# Patient Record
Sex: Female | Born: 1970 | Race: Black or African American | Hispanic: No | Smoking: Never smoker
Health system: Southern US, Community
[De-identification: ages and names within clinical notes are randomized; demographics above are authoritative.]

## PROBLEM LIST (undated history)

## (undated) DIAGNOSIS — R7303 Prediabetes: Secondary | ICD-10-CM

## (undated) DIAGNOSIS — T7840XA Allergy, unspecified, initial encounter: Secondary | ICD-10-CM

## (undated) DIAGNOSIS — E785 Hyperlipidemia, unspecified: Secondary | ICD-10-CM

## (undated) DIAGNOSIS — K5790 Diverticulosis of intestine, part unspecified, without perforation or abscess without bleeding: Secondary | ICD-10-CM

## (undated) DIAGNOSIS — D573 Sickle-cell trait: Secondary | ICD-10-CM

## (undated) DIAGNOSIS — E739 Lactose intolerance, unspecified: Secondary | ICD-10-CM

## (undated) DIAGNOSIS — E042 Nontoxic multinodular goiter: Secondary | ICD-10-CM

## (undated) DIAGNOSIS — M7989 Other specified soft tissue disorders: Secondary | ICD-10-CM

## (undated) DIAGNOSIS — I509 Heart failure, unspecified: Secondary | ICD-10-CM

## (undated) DIAGNOSIS — K8020 Calculus of gallbladder without cholecystitis without obstruction: Secondary | ICD-10-CM

## (undated) DIAGNOSIS — M549 Dorsalgia, unspecified: Secondary | ICD-10-CM

## (undated) DIAGNOSIS — K5900 Constipation, unspecified: Secondary | ICD-10-CM

## (undated) DIAGNOSIS — J309 Allergic rhinitis, unspecified: Secondary | ICD-10-CM

## (undated) DIAGNOSIS — E782 Mixed hyperlipidemia: Secondary | ICD-10-CM

## (undated) DIAGNOSIS — D259 Leiomyoma of uterus, unspecified: Secondary | ICD-10-CM

## (undated) DIAGNOSIS — K5792 Diverticulitis of intestine, part unspecified, without perforation or abscess without bleeding: Secondary | ICD-10-CM

## (undated) DIAGNOSIS — I10 Essential (primary) hypertension: Secondary | ICD-10-CM

## (undated) DIAGNOSIS — R0602 Shortness of breath: Secondary | ICD-10-CM

## (undated) DIAGNOSIS — I700 Atherosclerosis of aorta: Secondary | ICD-10-CM

## (undated) DIAGNOSIS — D1803 Hemangioma of intra-abdominal structures: Secondary | ICD-10-CM

## (undated) DIAGNOSIS — E669 Obesity, unspecified: Secondary | ICD-10-CM

## (undated) DIAGNOSIS — F32A Depression, unspecified: Secondary | ICD-10-CM

## (undated) HISTORY — DX: Lactose intolerance, unspecified: E73.9

## (undated) HISTORY — PX: OPEN REDUCTION INTERNAL FIXATION (ORIF) METACARPAL: SHX6234

## (undated) HISTORY — DX: Diverticulitis of intestine, part unspecified, without perforation or abscess without bleeding: K57.92

## (undated) HISTORY — DX: Allergy, unspecified, initial encounter: T78.40XA

## (undated) HISTORY — DX: Other specified soft tissue disorders: M79.89

## (undated) HISTORY — DX: Heart failure, unspecified: I50.9

## (undated) HISTORY — DX: Constipation, unspecified: K59.00

## (undated) HISTORY — DX: Obesity, unspecified: E66.9

## (undated) HISTORY — DX: Diverticulosis of intestine, part unspecified, without perforation or abscess without bleeding: K57.90

## (undated) HISTORY — DX: Dorsalgia, unspecified: M54.9

## (undated) HISTORY — DX: Shortness of breath: R06.02

## (undated) HISTORY — DX: Sickle-cell trait: D57.3

## (undated) HISTORY — PX: BLADDER REMOVAL: SHX567

## (undated) HISTORY — DX: Mixed hyperlipidemia: E78.2

## (undated) HISTORY — DX: Allergic rhinitis, unspecified: J30.9

## (undated) HISTORY — PX: HAND SURGERY: SHX662

## (undated) HISTORY — DX: Depression, unspecified: F32.A

## (undated) HISTORY — PX: CHOLECYSTECTOMY, LAPAROSCOPIC: SHX56

## (undated) HISTORY — DX: Calculus of gallbladder without cholecystitis without obstruction: K80.20

## (undated) HISTORY — DX: Leiomyoma of uterus, unspecified: D25.9

## (undated) HISTORY — DX: Nontoxic multinodular goiter: E04.2

## (undated) HISTORY — DX: Essential (primary) hypertension: I10

## (undated) HISTORY — DX: Prediabetes: R73.03

## (undated) HISTORY — DX: Hemangioma of intra-abdominal structures: D18.03

## (undated) HISTORY — PX: TUBAL LIGATION: SHX77

## (undated) HISTORY — DX: Atherosclerosis of aorta: I70.0

## (undated) HISTORY — DX: Hyperlipidemia, unspecified: E78.5

## (undated) HISTORY — PX: COLONOSCOPY: SHX174

---

## 1999-07-15 ENCOUNTER — Ambulatory Visit (HOSPITAL_COMMUNITY): Admission: RE | Admit: 1999-07-15 | Discharge: 1999-07-15 | Payer: Self-pay | Admitting: *Deleted

## 1999-10-06 ENCOUNTER — Encounter (INDEPENDENT_AMBULATORY_CARE_PROVIDER_SITE_OTHER): Payer: Self-pay

## 1999-10-06 ENCOUNTER — Ambulatory Visit (HOSPITAL_COMMUNITY): Admission: RE | Admit: 1999-10-06 | Discharge: 1999-10-07 | Payer: Self-pay | Admitting: General Surgery

## 1999-12-12 ENCOUNTER — Other Ambulatory Visit: Admission: RE | Admit: 1999-12-12 | Discharge: 1999-12-12 | Payer: Self-pay | Admitting: Obstetrics and Gynecology

## 2000-08-06 ENCOUNTER — Inpatient Hospital Stay (HOSPITAL_COMMUNITY): Admission: AD | Admit: 2000-08-06 | Discharge: 2000-08-06 | Payer: Self-pay | Admitting: Internal Medicine

## 2000-08-09 ENCOUNTER — Inpatient Hospital Stay (HOSPITAL_COMMUNITY): Admission: AD | Admit: 2000-08-09 | Discharge: 2000-08-09 | Payer: Self-pay | Admitting: Obstetrics & Gynecology

## 2000-08-16 ENCOUNTER — Inpatient Hospital Stay (HOSPITAL_COMMUNITY): Admission: AD | Admit: 2000-08-16 | Discharge: 2000-08-16 | Payer: Self-pay | Admitting: Obstetrics & Gynecology

## 2000-08-24 ENCOUNTER — Encounter: Admission: RE | Admit: 2000-08-24 | Discharge: 2000-08-24 | Payer: Self-pay | Admitting: Obstetrics & Gynecology

## 2000-08-31 ENCOUNTER — Encounter: Admission: RE | Admit: 2000-08-31 | Discharge: 2000-08-31 | Payer: Self-pay | Admitting: Obstetrics & Gynecology

## 2000-12-05 ENCOUNTER — Other Ambulatory Visit: Admission: RE | Admit: 2000-12-05 | Discharge: 2000-12-05 | Payer: Self-pay | Admitting: Obstetrics and Gynecology

## 2001-04-22 ENCOUNTER — Inpatient Hospital Stay (HOSPITAL_COMMUNITY): Admission: AD | Admit: 2001-04-22 | Discharge: 2001-04-22 | Payer: Self-pay | Admitting: Obstetrics and Gynecology

## 2001-04-23 ENCOUNTER — Inpatient Hospital Stay (HOSPITAL_COMMUNITY): Admission: AD | Admit: 2001-04-23 | Discharge: 2001-04-23 | Payer: Self-pay | Admitting: *Deleted

## 2001-07-03 ENCOUNTER — Inpatient Hospital Stay (HOSPITAL_COMMUNITY): Admission: RE | Admit: 2001-07-03 | Discharge: 2001-07-06 | Payer: Self-pay | Admitting: Obstetrics and Gynecology

## 2001-08-01 ENCOUNTER — Other Ambulatory Visit: Admission: RE | Admit: 2001-08-01 | Discharge: 2001-08-01 | Payer: Self-pay | Admitting: Obstetrics and Gynecology

## 2002-08-04 ENCOUNTER — Other Ambulatory Visit: Admission: RE | Admit: 2002-08-04 | Discharge: 2002-08-04 | Payer: Self-pay | Admitting: Obstetrics & Gynecology

## 2003-01-01 ENCOUNTER — Ambulatory Visit (HOSPITAL_COMMUNITY): Admission: RE | Admit: 2003-01-01 | Discharge: 2003-01-01 | Payer: Self-pay | Admitting: Obstetrics

## 2003-12-18 ENCOUNTER — Emergency Department (HOSPITAL_COMMUNITY): Admission: EM | Admit: 2003-12-18 | Discharge: 2003-12-18 | Payer: Self-pay | Admitting: Emergency Medicine

## 2003-12-25 ENCOUNTER — Ambulatory Visit (HOSPITAL_BASED_OUTPATIENT_CLINIC_OR_DEPARTMENT_OTHER): Admission: RE | Admit: 2003-12-25 | Discharge: 2003-12-25 | Payer: Self-pay | Admitting: Orthopedic Surgery

## 2008-01-20 ENCOUNTER — Ambulatory Visit (HOSPITAL_COMMUNITY): Admission: RE | Admit: 2008-01-20 | Discharge: 2008-01-20 | Payer: Self-pay | Admitting: General Practice

## 2008-06-11 ENCOUNTER — Encounter: Admission: RE | Admit: 2008-06-11 | Discharge: 2008-06-11 | Payer: Self-pay | Admitting: General Practice

## 2011-03-03 NOTE — Op Note (Signed)
   NAME:  COREY, CAULFIELD                          ACCOUNT NO.:  [id number]   MEDICAL RECORD NO.:  [id number]                   PATIENT TYPE:  AMB   LOCATION:  SDC                                  FACILITY:  [facility]   PHYSICIAN:  Kathreen Cosier, M.D.           DATE OF BIRTH:  [birth year]   DATE OF PROCEDURE:  01/01/2003  DATE OF DISCHARGE:                                 OPERATIVE REPORT   PREOPERATIVE DIAGNOSES:  Multiparity.   PROCEDURE:  Laparoscopic tubal sterilization.  Under general anesthesia  patient in lithotomy position.  Abdomen, perineum, and vagina prepped and  draped.  Bladder emptied with straight catheter.  Weighted speculum placed  in vagina and the Hulka tenaculum placed in the cervix.  In the umbilicus a  transverse incision made, carried down to the fascia.  The fascia cleaned,  grasped with two Kochers.  Fascia in the peritoneum opened with the Mayo  scissors.  The sleeve of the trocar inserted intraperitoneally.  3 L carbon  dioxide infused intraperitoneally.  Visualizing scope inserted.  Uterus,  tubes, and ovaries are normal.  Cautery probe inserted.  Right tube grasped  1 inch from the cornu and cauterized.  Tube was cauterized in a total of  four places moving lateral from the first site of cautery.  Procedure was  done in the exact fashion on the other side.  Probe removed.  CO2 allowed to  escape from the peritoneal cavity.  Fascia closed with one stitch of 0 Dexon  and the skin closed with subcuticular stitch of 3-0 plain.  The patient  tolerated procedure well.  Taken to recovery room in good condition.                                               Kathreen Cosier, M.D.    BAM/MEDQ  D:  01/01/2003  T:  01/01/2003  Job:  [id number]

## 2011-03-06 ENCOUNTER — Emergency Department (HOSPITAL_COMMUNITY)
Admission: EM | Admit: 2011-03-06 | Discharge: 2011-03-07 | Disposition: A | Discharge status: Home / Self Care | Payer: Self-pay | Attending: Emergency Medicine | Admitting: Emergency Medicine

## 2011-03-06 DIAGNOSIS — R609 Edema, unspecified: Secondary | ICD-10-CM | POA: Insufficient documentation

## 2011-03-06 DIAGNOSIS — M7989 Other specified soft tissue disorders: Secondary | ICD-10-CM | POA: Insufficient documentation

## 2011-03-06 LAB — CBC
HCT: 35.5 % — ABNORMAL LOW (ref 36.0–46.0)
MCV: 88.1 fL (ref 78.0–100.0)
RBC: 4.03 MIL/uL (ref 3.87–5.11)
RDW: 13.6 % (ref 11.5–15.5)
WBC: 7 10*3/uL (ref 4.0–10.5)

## 2011-03-06 LAB — URINALYSIS, ROUTINE W REFLEX MICROSCOPIC
Bilirubin Urine: NEGATIVE
Glucose, UA: NEGATIVE mg/dL
Hgb urine dipstick: NEGATIVE
Specific Gravity, Urine: 1.03 (ref 1.005–1.030)
pH: 5.5 (ref 5.0–8.0)

## 2011-03-06 LAB — DIFFERENTIAL
Eosinophils Relative: 3 % (ref 0–5)
Lymphocytes Relative: 45 % (ref 12–46)
Lymphs Abs: 3.2 10*3/uL (ref 0.7–4.0)

## 2011-03-06 LAB — COMPREHENSIVE METABOLIC PANEL
ALT: 10 U/L (ref 0–35)
Alkaline Phosphatase: 80 U/L (ref 39–117)
CO2: 29 mEq/L (ref 19–32)
Chloride: 102 mEq/L (ref 96–112)
GFR calc non Af Amer: >60 mL/min (ref >60)
Glucose, Bld: 122 mg/dL — ABNORMAL HIGH (ref 70–99)
Potassium: 3.1 mEq/L — ABNORMAL LOW (ref 3.5–5.1)
Sodium: 138 mEq/L (ref 135–145)

## 2011-09-05 ENCOUNTER — Other Ambulatory Visit (HOSPITAL_COMMUNITY): Payer: Self-pay | Admitting: General Practice

## 2011-09-05 DIAGNOSIS — Z1231 Encounter for screening mammogram for malignant neoplasm of breast: Secondary | ICD-10-CM

## 2011-09-06 ENCOUNTER — Ambulatory Visit (HOSPITAL_COMMUNITY)
Admission: RE | Admit: 2011-09-06 | Discharge: 2011-09-06 | Disposition: A | Discharge status: Home / Self Care | Payer: Self-pay | Source: Ambulatory Visit | Attending: General Practice | Admitting: General Practice

## 2011-09-06 DIAGNOSIS — Z1231 Encounter for screening mammogram for malignant neoplasm of breast: Secondary | ICD-10-CM

## 2012-12-06 ENCOUNTER — Other Ambulatory Visit (HOSPITAL_COMMUNITY): Payer: Self-pay | Admitting: Nurse Practitioner

## 2012-12-06 DIAGNOSIS — Z1231 Encounter for screening mammogram for malignant neoplasm of breast: Secondary | ICD-10-CM

## 2012-12-11 DIAGNOSIS — E785 Hyperlipidemia, unspecified: Secondary | ICD-10-CM | POA: Insufficient documentation

## 2012-12-13 ENCOUNTER — Ambulatory Visit (HOSPITAL_COMMUNITY): Payer: Self-pay

## 2012-12-16 ENCOUNTER — Ambulatory Visit (HOSPITAL_COMMUNITY)
Admission: RE | Admit: 2012-12-16 | Discharge: 2012-12-16 | Disposition: A | Discharge status: Home / Self Care | Payer: Medicaid Other | Source: Ambulatory Visit | Attending: Nurse Practitioner | Admitting: Nurse Practitioner

## 2012-12-16 DIAGNOSIS — Z1231 Encounter for screening mammogram for malignant neoplasm of breast: Secondary | ICD-10-CM | POA: Insufficient documentation

## 2014-02-04 DIAGNOSIS — R609 Edema, unspecified: Secondary | ICD-10-CM | POA: Insufficient documentation

## 2014-02-04 DIAGNOSIS — J302 Other seasonal allergic rhinitis: Secondary | ICD-10-CM | POA: Insufficient documentation

## 2014-02-04 DIAGNOSIS — R490 Dysphonia: Secondary | ICD-10-CM | POA: Insufficient documentation

## 2015-03-21 ENCOUNTER — Encounter (HOSPITAL_BASED_OUTPATIENT_CLINIC_OR_DEPARTMENT_OTHER): Payer: Self-pay | Admitting: Emergency Medicine

## 2015-03-21 ENCOUNTER — Emergency Department (HOSPITAL_BASED_OUTPATIENT_CLINIC_OR_DEPARTMENT_OTHER): Payer: Medicaid Other

## 2015-03-21 ENCOUNTER — Emergency Department (HOSPITAL_BASED_OUTPATIENT_CLINIC_OR_DEPARTMENT_OTHER)
Admission: EM | Admit: 2015-03-21 | Discharge: 2015-03-21 | Disposition: A | Discharge status: Home / Self Care | Payer: Medicaid Other | Attending: Emergency Medicine | Admitting: Emergency Medicine

## 2015-03-21 ENCOUNTER — Emergency Department (HOSPITAL_COMMUNITY)
Admission: EM | Admit: 2015-03-21 | Discharge: 2015-03-21 | Disposition: A | Discharge status: Home / Self Care | Payer: Medicaid Other

## 2015-03-21 DIAGNOSIS — K5733 Diverticulitis of large intestine without perforation or abscess with bleeding: Secondary | ICD-10-CM | POA: Insufficient documentation

## 2015-03-21 DIAGNOSIS — Z3202 Encounter for pregnancy test, result negative: Secondary | ICD-10-CM | POA: Diagnosis not present

## 2015-03-21 DIAGNOSIS — R1031 Right lower quadrant pain: Secondary | ICD-10-CM | POA: Diagnosis present

## 2015-03-21 LAB — COMPREHENSIVE METABOLIC PANEL
ALBUMIN: 3.5 g/dL (ref 3.5–5.0)
ALT: 11 U/L — ABNORMAL LOW (ref 14–54)
ANION GAP: 6 (ref 5–15)
AST: 16 U/L (ref 15–41)
Alkaline Phosphatase: 76 U/L (ref 38–126)
BUN: 14 mg/dL (ref 6–20)
CALCIUM: 8.7 mg/dL — AB (ref 8.9–10.3)
CO2: 29 mmol/L (ref 22–32)
CREATININE: 0.81 mg/dL (ref 0.44–1.00)
Chloride: 106 mmol/L (ref 101–111)
GFR calc non Af Amer: >60 mL/min (ref >60)
Glucose, Bld: 98 mg/dL (ref 65–99)
Potassium: 3.4 mmol/L — ABNORMAL LOW (ref 3.5–5.1)
Sodium: 141 mmol/L (ref 135–145)
TOTAL PROTEIN: 7.7 g/dL (ref 6.5–8.1)
Total Bilirubin: 0.3 mg/dL (ref 0.3–1.2)

## 2015-03-21 LAB — CBC WITH DIFFERENTIAL/PLATELET
BASOS ABS: 0 10*3/uL (ref 0.0–0.1)
BASOS PCT: 0 % (ref 0–1)
Eosinophils Absolute: 0.2 10*3/uL (ref 0.0–0.7)
Eosinophils Relative: 3 % (ref 0–5)
HEMATOCRIT: 39.3 % (ref 36.0–46.0)
Hemoglobin: 13.1 g/dL (ref 12.0–15.0)
LYMPHS ABS: 1.7 10*3/uL (ref 0.7–4.0)
Lymphocytes Relative: 30 % (ref 12–46)
MCH: 29 pg (ref 26.0–34.0)
MCHC: 33.3 g/dL (ref 30.0–36.0)
MCV: 87.1 fL (ref 78.0–100.0)
Monocytes Absolute: 0.6 10*3/uL (ref 0.1–1.0)
Monocytes Relative: 10 % (ref 3–12)
NEUTROS PCT: 57 % (ref 43–77)
Neutro Abs: 3.3 10*3/uL (ref 1.7–7.7)
PLATELETS: 235 10*3/uL (ref 150–400)
RBC: 4.51 MIL/uL (ref 3.87–5.11)
RDW: 12.5 % (ref 11.5–15.5)
WBC: 5.7 10*3/uL (ref 4.0–10.5)

## 2015-03-21 LAB — URINALYSIS, ROUTINE W REFLEX MICROSCOPIC
Bilirubin Urine: NEGATIVE
GLUCOSE, UA: NEGATIVE mg/dL
Ketones, ur: NEGATIVE mg/dL
LEUKOCYTES UA: NEGATIVE
NITRITE: NEGATIVE
PH: 5.5 (ref 5.0–8.0)
Protein, ur: NEGATIVE mg/dL
Specific Gravity, Urine: 1.034 — ABNORMAL HIGH (ref 1.005–1.030)
Urobilinogen, UA: 0.2 mg/dL (ref 0.0–1.0)

## 2015-03-21 LAB — URINE MICROSCOPIC-ADD ON

## 2015-03-21 LAB — LIPASE, BLOOD: LIPASE: 16 U/L — AB (ref 22–51)

## 2015-03-21 LAB — PREGNANCY, URINE: Preg Test, Ur: NEGATIVE

## 2015-03-21 LAB — OCCULT BLOOD X 1 CARD TO LAB, STOOL: FECAL OCCULT BLD: POSITIVE — AB

## 2015-03-21 MED ORDER — OXYCODONE-ACETAMINOPHEN 5-325 MG PO TABS: ORAL_TABLET | ORAL | Status: DC

## 2015-03-21 MED ORDER — CIPROFLOXACIN HCL 500 MG PO TABS
500.0000 mg | ORAL_TABLET | Freq: Once | ORAL | Status: AC
  Administered 2015-03-21: 500 mg via ORAL
  Filled 2015-03-21: qty 1

## 2015-03-21 MED ORDER — IOHEXOL 300 MG/ML  SOLN
100.0000 mL | Freq: Once | INTRAMUSCULAR | Status: AC | PRN
  Administered 2015-03-21: 100 mL via INTRAVENOUS

## 2015-03-21 MED ORDER — ONDANSETRON HCL 4 MG/2ML IJ SOLN
4.0000 mg | Freq: Once | INTRAMUSCULAR | Status: AC
  Administered 2015-03-21: 4 mg via INTRAVENOUS
  Filled 2015-03-21: qty 2

## 2015-03-21 MED ORDER — MORPHINE SULFATE 4 MG/ML IJ SOLN
4.0000 mg | Freq: Once | INTRAMUSCULAR | Status: AC
  Administered 2015-03-21: 4 mg via INTRAVENOUS
  Filled 2015-03-21: qty 1

## 2015-03-21 MED ORDER — FLUCONAZOLE 150 MG PO TABS: 150.0000 mg | ORAL_TABLET | Freq: Every day | ORAL | Status: DC

## 2015-03-21 MED ORDER — MORPHINE SULFATE 4 MG/ML IJ SOLN
4.0000 mg | INTRAMUSCULAR | Status: DC | PRN
  Administered 2015-03-21: 4 mg via INTRAVENOUS
  Filled 2015-03-21: qty 1

## 2015-03-21 MED ORDER — ONDANSETRON HCL 4 MG PO TABS: 4.0000 mg | ORAL_TABLET | Freq: Three times a day (TID) | ORAL | Status: DC | PRN

## 2015-03-21 MED ORDER — METRONIDAZOLE 500 MG PO TABS: 500.0000 mg | ORAL_TABLET | Freq: Three times a day (TID) | ORAL | Status: DC

## 2015-03-21 MED ORDER — IOHEXOL 300 MG/ML  SOLN
25.0000 mL | Freq: Once | INTRAMUSCULAR | Status: AC | PRN
  Administered 2015-03-21: 25 mL via ORAL

## 2015-03-21 MED ORDER — CIPROFLOXACIN HCL 500 MG PO TABS: 500.0000 mg | ORAL_TABLET | Freq: Two times a day (BID) | ORAL | Status: DC

## 2015-03-21 MED ORDER — METRONIDAZOLE 500 MG PO TABS
500.0000 mg | ORAL_TABLET | Freq: Once | ORAL | Status: AC
  Administered 2015-03-21: 500 mg via ORAL
  Filled 2015-03-21: qty 1

## 2015-03-21 NOTE — ED Notes (Signed)
Pt left without being seen.

## 2015-03-21 NOTE — ED Notes (Signed)
Patient reports vomiting which began yesterday x 3.  Reports sharp abdominal pains with diarrhea and has seen clots of blood in her stool.  Reports bright red blood.

## 2015-03-21 NOTE — ED Provider Notes (Signed)
CSN: [id number]     Arrival date & time 03/21/15  1352 History   First MD Initiated Contact with Patient 03/21/15 1411     Chief Complaint  Patient presents with  . Abdominal Pain     (Consider location/radiation/quality/duration/timing/severity/associated sxs/prior Treatment) HPI   Crystal Shea is a 44 y.o. female complaining of 8 out of 10 bilateral lower abdominal pain associated with bloody diarrhea and multiple episodes of nonbilious, nonbloody, non-coffee ground emesis starting yesterday. Patient states that she has been tolerating by mouth today but the bloody diarrhea still continues. She denies fevers, chills, sick contacts, history of diverticulitis/diverticulosis, recent abx. No pain medication taken prior to arrival. No exacerbating or alleviating factors identified.  History reviewed. No pertinent past medical history. Past Surgical History  Procedure Laterality Date  . Tubal ligation    . Cesarean section      x2  . Bladder removal    . Hand surgery Left    History reviewed. No pertinent family history. History  Substance Use Topics  . Smoking status: Never Smoker   . Smokeless tobacco: Not on file  . Alcohol Use: No   OB History    No data available     Review of Systems  10 systems reviewed and found to be negative, except as noted in the HPI.   Allergies  Review of patient's allergies indicates no known allergies.  Home Medications   Prior to Admission medications   Medication Sig Start Date End Date Taking? Authorizing Provider  ciprofloxacin (CIPRO) 500 MG tablet Take 1 tablet (500 mg total) by mouth every 12 (twelve) hours. 03/21/15   Gonzalo Waymire, PA-C  fluconazole (DIFLUCAN) 150 MG tablet Take 1 tablet (150 mg total) by mouth daily. Can repeat in 3 days if symptoms persist 03/21/15   Elmyra Ricks Jourdan Durbin, PA-C  metroNIDAZOLE (FLAGYL) 500 MG tablet Take 1 tablet (500 mg total) by mouth 3 (three) times daily. Do not drink alcohol while taking this  medication or for several days after you finish the course 03/21/15   Elmyra Ricks Johanna Stafford, PA-C  ondansetron (ZOFRAN) 4 MG tablet Take 1 tablet (4 mg total) by mouth every 8 (eight) hours as needed for nausea or vomiting. 03/21/15   Monico Blitz, PA-C  oxyCODONE-acetaminophen (PERCOCET/ROXICET) 5-325 MG per tablet 1 to 2 tabs PO q6hrs  PRN for pain 03/21/15   Elmyra Ricks Krystn Dermody, PA-C   BP 124/70 mmHg  Pulse 62  Temp(Src) 98.8 F (37.1 C) (Oral)  Resp 16  Ht 5\' 3"  (1.6 m)  Wt 198 lb (89.812 kg)  BMI 35.08 kg/m2  SpO2 100%  LMP 02/03/2015 (Approximate) Physical Exam  Constitutional: She is oriented to person, place, and time. She appears well-developed and well-nourished. No distress.  HENT:  Head: Normocephalic and atraumatic.  Mouth/Throat: Oropharynx is clear and moist.  Eyes: Conjunctivae and EOM are normal. Pupils are equal, round, and reactive to light.  Neck: Normal range of motion.  Cardiovascular: Normal rate, regular rhythm and intact distal pulses.   Pulmonary/Chest: Effort normal and breath sounds normal. No stridor.  Abdominal: Soft. There is tenderness.  Mild tenderness to deep palpation of the left and right lower quadrants. No gaurding or rebound.  Musculoskeletal: Normal range of motion.  Neurological: She is alert and oriented to person, place, and time.  Skin: She is not diaphoretic.  Psychiatric: She has a normal mood and affect.  Nursing note and vitals reviewed.   ED Course  Procedures (including critical care time) Labs Review Labs  Reviewed  URINALYSIS, ROUTINE W REFLEX MICROSCOPIC (NOT AT [facility]) - Abnormal; Notable for the following:    Color, Urine AMBER (*)    Specific Gravity, Urine 1.034 (*)    Hgb urine dipstick TRACE (*)    All other components within normal limits  OCCULT BLOOD X 1 CARD TO LAB, STOOL - Abnormal; Notable for the following:    Fecal Occult Bld POSITIVE (*)    All other components within normal limits  COMPREHENSIVE METABOLIC PANEL -  Abnormal; Notable for the following:    Potassium 3.4 (*)    Calcium 8.7 (*)    ALT 11 (*)    All other components within normal limits  LIPASE, BLOOD - Abnormal; Notable for the following:    Lipase 16 (*)    All other components within normal limits  URINE MICROSCOPIC-ADD ON - Abnormal; Notable for the following:    Crystals CA OXALATE CRYSTALS (*)    All other components within normal limits  PREGNANCY, URINE  CBC WITH DIFFERENTIAL/PLATELET    Imaging Review Ct Abdomen Pelvis W Contrast  03/21/2015   CLINICAL DATA:  Low abdominal pain with nausea, vomiting and bright red blood per rectum for 2 days. History of C-section and cholecystectomy. Negative urine pregnancy test today.  EXAM: CT ABDOMEN AND PELVIS WITH CONTRAST  TECHNIQUE: Multidetector CT imaging of the abdomen and pelvis was performed using the standard protocol following bolus administration of intravenous contrast.  CONTRAST:  53mL OMNIPAQUE IOHEXOL 300 MG/ML SOLN, 171mL OMNIPAQUE IOHEXOL 300 MG/ML SOLN  COMPARISON:  None.  FINDINGS: Lower chest: Minimal right middle lobe airspace disease is likely inflammatory. There is no consolidation or suspicious nodule within the visualized lung bases. There is no pleural or pericardial effusion.  Hepatobiliary: There is a single indeterminate ill-defined low-density lesion inferiorly in the right hepatic lobe measuring 12 mm on image 22. This is less evident on delayed images and could reflect an atypical hemangioma. The liver otherwise appears normal. There is no significant biliary dilatation status post cholecystectomy.  Pancreas: Unremarkable. No pancreatic ductal dilatation or surrounding inflammatory changes.  Spleen: Normal in size without focal abnormality.  Adrenals/Urinary Tract: Both adrenal glands appear normal.The kidneys appear normal without evidence of urinary tract calculus, suspicious lesion or hydronephrosis. No bladder abnormalities are seen.  Stomach/Bowel: There is mild  long segment distal descending and proximal sigmoid colon wall thickening. There are few sigmoid diverticula. No significant surrounding inflammatory change identified. The appendix and small bowel appear normal. There is no evidence of bowel obstruction.  Vascular/Lymphatic: There are no enlarged abdominal or pelvic lymph nodes. No significant vascular findings are present.  Reproductive: Probable 3.3 cm left uterine submucosal fibroid on image 69. There is an adjacent oval fluid collection within the endometrial canal measuring up to 3.2 cm on the same image. This may reflect fluid in the canal distorted by the submucosal fibroid. No adnexal mass or surrounding inflammatory change per  Other: No evidence of abdominal wall mass or hernia.  Musculoskeletal: No acute or significant osseous findings.  IMPRESSION: 1. Long segment wall thickening at the descending sigmoid colon junction, likely representing diverticulitis. No evidence of perforation or abscess. 2. The appendix appears normal. 3. Single indeterminate low-density lesion inferiorly in the right hepatic lobe. Without leukocytosis, this is unlikely to reflect an abscess related to the suspected diverticulitis. Given the patient's young age, this is likely an incidental finding, possibly an atypical hemangioma. 4. Uterine fibroid with adjacent focal fluid in the endometrial canal.  Electronically Signed   By: Richardean Sale M.D.   On: 03/21/2015 16:20     EKG Interpretation None      MDM   Final diagnoses:  Diverticulitis of large intestine without perforation or abscess with bleeding   Filed Vitals:   03/21/15 1405 03/21/15 1619 03/21/15 1740  BP: 140/74 118/60 124/70  Pulse: 66 58 62  Temp: 98.8 F (37.1 C)    TempSrc: Oral    Resp: 18 18 16   Height: 5\' 3"  (1.6 m)    Weight: 198 lb (89.812 kg)    SpO2: 100% 100% 100%    Medications  morphine 4 MG/ML injection 4 mg (4 mg Intravenous Given 03/21/15 1703)  morphine 4 MG/ML injection  4 mg (4 mg Intravenous Given 03/21/15 1447)  ondansetron (ZOFRAN) injection 4 mg (4 mg Intravenous Given 03/21/15 1447)  iohexol (OMNIPAQUE) 300 MG/ML solution 25 mL (25 mLs Oral Contrast Given 03/21/15 1553)  iohexol (OMNIPAQUE) 300 MG/ML solution 100 mL (100 mLs Intravenous Contrast Given 03/21/15 1553)  ondansetron (ZOFRAN) injection 4 mg (4 mg Intravenous Given 03/21/15 1703)  ciprofloxacin (CIPRO) tablet 500 mg (500 mg Oral Given 03/21/15 1703)  metroNIDAZOLE (FLAGYL) tablet 500 mg (500 mg Oral Given 03/21/15 1703)    Crystal Shea is a pleasant 44 y.o. female presenting with hematochezia, diarrhea and vomiting with moderate to severe lower abdominal pain. Abdominal exam is nonsurgical. Blood work without significant abnormality. CT shows uncomplicated diverticulitis. Intrahepatic lesion likely atypical hemangioma. She is tolerating by mouth, reports improvement in her pain and repeat abdominal exam remains unchanged.  Counseled patient to take a full liquid diet for the next 3 days, with had an extensive discussion of return precautions patient verbalizes her understanding. She is requesting treatment for yeast infection she states Flagyl usually causes yeast infection. I've counseled patient on return precautions for both worsening diverticulitis and worsening anemia  Evaluation does not show pathology that would require ongoing emergent intervention or inpatient treatment. Pt is hemodynamically stable and mentating appropriately. Discussed findings and plan with patient/guardian, who agrees with care plan. All questions answered. Return precautions discussed and outpatient follow up given.   Discharge Medication List as of 03/21/2015  4:57 PM    START taking these medications   Details  ciprofloxacin (CIPRO) 500 MG tablet Take 1 tablet (500 mg total) by mouth every 12 (twelve) hours., Starting 03/21/2015, Until Discontinued, Print    metroNIDAZOLE (FLAGYL) 500 MG tablet Take 1 tablet (500 mg total) by  mouth 3 (three) times daily. Do not drink alcohol while taking this medication or for several days after you finish the course, Starting 03/21/2015, Until Discontinued, Print    ondansetron (ZOFRAN) 4 MG tablet Take 1 tablet (4 mg total) by mouth every 8 (eight) hours as needed for nausea or vomiting., Starting 03/21/2015, Until Discontinued, Print    oxyCODONE-acetaminophen (PERCOCET/ROXICET) 5-325 MG per tablet 1 to 2 tabs PO q6hrs  PRN for pain, Print             Monico Blitz, PA-C 03/21/15 [place], MD 03/21/15 2340

## 2015-03-21 NOTE — Discharge Instructions (Signed)
Maintain a full liquid diet for at least 3 days. Obtain over-the-counter probiotic medications. You can ask her pharmacist for recommendations.  Take your antibiotics as directed and to completion. You should never have any leftover antibiotics! Push fluids and stay well hydrated.   Do not drink alcohol while you are taking flagyl (metronidazole) because it will make you very sick.  Push fluids: take small frequent sips of water or Gatorade, do not drink any soda, juice or caffeinated beverages.   Please follow with your primary care doctor in the next 2 days for a check-up. They must obtain records for further management.   Do not hesitate to return to the Emergency Department for any new, worsening or concerning symptoms.    Diverticulitis Diverticulitis is inflammation or infection of small pouches in your colon that form when you have a condition called diverticulosis. The pouches in your colon are called diverticula. Your colon, or large intestine, is where water is absorbed and stool is formed. Complications of diverticulitis can include:  Bleeding.  Severe infection.  Severe pain.  Perforation of your colon.  Obstruction of your colon. CAUSES  Diverticulitis is caused by bacteria. Diverticulitis happens when stool becomes trapped in diverticula. This allows bacteria to grow in the diverticula, which can lead to inflammation and infection. RISK FACTORS People with diverticulosis are at risk for diverticulitis. Eating a diet that does not include enough fiber from fruits and vegetables may make diverticulitis more likely to develop. SYMPTOMS  Symptoms of diverticulitis may include:  Abdominal pain and tenderness. The pain is normally located on the left side of the abdomen, but may occur in other areas.  Fever and chills.  Bloating.  Cramping.  Nausea.  Vomiting.  Constipation.  Diarrhea.  Blood in your stool. DIAGNOSIS  Your health care provider will ask  you about your medical history and do a physical exam. You may need to have tests done because many medical conditions can cause the same symptoms as diverticulitis. Tests may include:  Blood tests.  Urine tests.  Imaging tests of the abdomen, including X-rays and CT scans. When your condition is under control, your health care provider may recommend that you have a colonoscopy. A colonoscopy can show how severe your diverticula are and whether something else is causing your symptoms. TREATMENT  Most cases of diverticulitis are mild and can be treated at home. Treatment may include:  Taking over-the-counter pain medicines.  Following a clear liquid diet.  Taking antibiotic medicines by mouth for 7-10 days. More severe cases may be treated at a hospital. Treatment may include:  Not eating or drinking.  Taking prescription pain medicine.  Receiving antibiotic medicines through an IV tube.  Receiving fluids and nutrition through an IV tube.  Surgery. HOME CARE INSTRUCTIONS   Follow your health care provider's instructions carefully.  Follow a full liquid diet or other diet as directed by your health care provider. After your symptoms improve, your health care provider may tell you to change your diet. He or she may recommend you eat a high-fiber diet. Fruits and vegetables are good sources of fiber. Fiber makes it easier to pass stool.  Take fiber supplements or probiotics as directed by your health care provider.  Only take medicines as directed by your health care provider.  Keep all your follow-up appointments. SEEK MEDICAL CARE IF:   Your pain does not improve.  You have a hard time eating food.  Your bowel movements do not return to normal. SEEK  IMMEDIATE MEDICAL CARE IF:   Your pain becomes worse.  Your symptoms do not get better.  Your symptoms suddenly get worse.  You have a fever.  You have repeated vomiting.  You have bloody or black, tarry  stools. MAKE SURE YOU:   Understand these instructions.  Will watch your condition.  Will get help right away if you are not doing well or get worse. Document Released: 07/12/2005 Document Revised: 10/07/2013 Document Reviewed: 08/27/2013 [facility] Patient Information 2015 [place], [place]. This information is not intended to replace advice given to you by your health care provider. Make sure you discuss any questions you have with your health care provider.

## 2017-01-26 DIAGNOSIS — E663 Overweight: Secondary | ICD-10-CM | POA: Insufficient documentation

## 2017-01-26 DIAGNOSIS — E669 Obesity, unspecified: Secondary | ICD-10-CM | POA: Insufficient documentation

## 2018-11-06 ENCOUNTER — Ambulatory Visit (HOSPITAL_COMMUNITY)
Admission: EM | Admit: 2018-11-06 | Discharge: 2018-11-06 | Disposition: A | Discharge status: Home / Self Care | Payer: BC Managed Care – PPO | Attending: Family Medicine | Admitting: Family Medicine

## 2018-11-06 ENCOUNTER — Encounter (HOSPITAL_COMMUNITY): Payer: Self-pay | Admitting: Emergency Medicine

## 2018-11-06 ENCOUNTER — Ambulatory Visit (INDEPENDENT_AMBULATORY_CARE_PROVIDER_SITE_OTHER): Payer: BC Managed Care – PPO

## 2018-11-06 DIAGNOSIS — R059 Cough, unspecified: Secondary | ICD-10-CM

## 2018-11-06 DIAGNOSIS — R0789 Other chest pain: Secondary | ICD-10-CM

## 2018-11-06 DIAGNOSIS — R05 Cough: Secondary | ICD-10-CM

## 2018-11-06 MED ORDER — BENZONATATE 100 MG PO CAPS: ORAL_CAPSULE | ORAL | 0 refills | Status: DC

## 2018-11-06 NOTE — ED Provider Notes (Signed)
[name]   [id number] 11/06/18 Arrival Time: 0905  ASSESSMENT & PLAN:  1. Cough   2. Chest pressure    ECG: Performed today and interpreted by me: normal sinus rhythm; non-specific T wave changes; no STEMI.  CXR: I have personally viewed the imaging studies ordered this visit. No acute cardiopulmonary disease; heart appears normal size. No symptoms to suggest acute cardiomyopathy.  With what sounds like a strong FH of cardiac disease, I recommend that she call her PCP today and discuss a referral to a cardiologist for evaluation. Verbal and written CP precautions given today (see AVS). To ED with any new or worsening symptoms.  Resolving viral illness with persistent cough. No pneumonia on CXR. Reassured. Meds ordered this encounter  Medications  . benzonatate (TESSALON) 100 MG capsule    Sig: Take 1 capsule by mouth every 8 (eight) hours for cough.    Dispense:  21 capsule    Refill:  0   Reviewed expectations re: course of current medical issues. Questions answered. Outlined signs and symptoms indicating need for more acute intervention. Patient verbalized understanding. After Visit Summary given.   SUBJECTIVE:  History from: patient. Crystal Shea is a 48 y.o. female who presents with complaint of nasal congestion, post-nasal drainage, and a persistent dry cough; without sore throat. Onset gradually, 4-5 days ago; with mild fatigue and with mild body aches. Questions subjective fever at onset but none over the past few days. Dry cough is bothering her the most now. Occasional coughs up mucous and tastes blood when coughing.  She also reports feeling a non-radiating "pressure in my chest" beginning a week before onset of cold symptoms described above. Continuing to feel. "Just feels like something's in my chest." Intermittent but now feels more often. May last several hours. Does not wake her at night. No back or abdominal pain. No specific aggravating or  alleviating factors reported. Chest pressure is not worsened with exertion. No LE edema. No recent prolonged immobilization or travel. Ambulatory without difficulty. Performing ADL without difficulty. SOB: none. Wheezing: "at times". Fever: no. Overall normal PO intake without n/v. Known sick contacts: no. Denies  irregular heart beat, near-syncope, orthopnea, palpitations and paroxysmal nocturnal dyspnea. Normal bowel/bladder habits. No new medications. No frequent alcohol use. Illicit drug use: none. Reports that she has never smoked.  Cardiac risk factors: family history of premature cardiovascular disease. DVT/PE risk factors: none.  Previous cardiac testing: none.  ROS: As per HPI. All other systems negative.   OBJECTIVE:  Vitals:   11/06/18 0930  BP: (!) 148/67  Pulse: 87  Resp: 16  Temp: 98.1 F (36.7 C)  TempSrc: Oral  SpO2: 100%    General appearance: alert, oriented, no acute distress Eyes: PERRLA; EOMI; conjunctivae normal HENT: normocephalic; atraumatic Neck: supple without FROM Lungs: without labored respirations; CTAB but with slightly coarse breath sounds at bases Heart: regular rate and rhythm without murmer Chest Wall: without significant tenderness to palpation Abdomen: soft, non-tender Extremities: without edema; without calf swelling or tenderness; symmetrical without gross deformities Skin: warm and dry; without rash or lesions Neuro: normal gait Psychological: alert and cooperative; normal mood and affect  Imaging: Dg Chest 2 View  Result Date: 11/06/2018 CLINICAL DATA:  48 year old female with chest pain and cough EXAM: CHEST - 2 VIEW COMPARISON:  None. FINDINGS: The heart size and mediastinal contours are within normal limits. Both lungs are clear. The visualized skeletal structures are unremarkable. IMPRESSION: Negative for acute cardiopulmonary disease Electronically Signed  By: Corrie Mckusick D.O.   On: 11/06/2018 10:37    No Known  Allergies  PMH: Diverticulitis.  Social History   Socioeconomic History  . Marital status: Single    Spouse name: Not on file  . Number of children: Not on file  . Years of education: Not on file  . Highest education level: Not on file  Occupational History  . Not on file  Social Needs  . Financial resource strain: Not on file  . Food insecurity:    Worry: Not on file    Inability: Not on file  . Transportation needs:    Medical: Not on file    Non-medical: Not on file  Tobacco Use  . Smoking status: Never Smoker  . Smokeless tobacco: Never Used  Substance and Sexual Activity  . Alcohol use: No  . Drug use: No  . Sexual activity: Not on file  Lifestyle  . Physical activity:    Days per week: Not on file    Minutes per session: Not on file  . Stress: Not on file  Relationships  . Social connections:    Talks on phone: Not on file    Gets together: Not on file    Attends religious service: Not on file    Active member of club or organization: Not on file    Attends meetings of clubs or organizations: Not on file    Relationship status: Not on file  . Intimate partner violence:    Fear of current or ex partner: Not on file    Emotionally abused: Not on file    Physically abused: Not on file    Forced sexual activity: Not on file  Other Topics Concern  . Not on file  Social History Narrative  . Not on file   FH: Sister recently required heart stenting secondary to CAD.  Past Surgical History:  Procedure Laterality Date  . BLADDER REMOVAL    . CESAREAN SECTION     x2  . HAND SURGERY Left   . TUBAL LIGATION       Vanessa Kick, MD 11/06/18 1130

## 2018-11-06 NOTE — ED Triage Notes (Signed)
Pt presents to [facility] for assessment of chills, cough, congestion, headaches.  States she tastes blood when she coughs.  C/o chest tightness.

## 2018-11-06 NOTE — Discharge Instructions (Addendum)
You have been seen at the [facility] today for chest pressure and coughing. Your evaluation today was not suggestive of any emergent condition requiring medical intervention at this time. Your ECG (heart tracing) did not show any worrisome changes. However, some medical problems make take more time to appear. Therefore, it's very important that you pay attention to any new symptoms or worsening of your current condition.  Please proceed directly to the Emergency Department immediately should you feel worse in any way or have any of the following symptoms: increasing or different chest pain, pain that spreads to your arm, neck, jaw, back or abdomen, shortness of breath, or nausea and vomiting.

## 2018-12-12 ENCOUNTER — Ambulatory Visit: Payer: BC Managed Care – PPO | Admitting: Cardiology

## 2018-12-12 ENCOUNTER — Encounter: Payer: Self-pay | Admitting: Cardiology

## 2018-12-12 VITALS — BP 152/92 | HR 63 | Ht 63.0 in | Wt 196.0 lb

## 2018-12-12 DIAGNOSIS — R0789 Other chest pain: Secondary | ICD-10-CM

## 2018-12-12 DIAGNOSIS — I10 Essential (primary) hypertension: Secondary | ICD-10-CM

## 2018-12-12 DIAGNOSIS — R600 Localized edema: Secondary | ICD-10-CM

## 2018-12-12 DIAGNOSIS — E782 Mixed hyperlipidemia: Secondary | ICD-10-CM | POA: Diagnosis not present

## 2018-12-12 DIAGNOSIS — Z8249 Family history of ischemic heart disease and other diseases of the circulatory system: Secondary | ICD-10-CM | POA: Diagnosis not present

## 2018-12-12 MED ORDER — LISINOPRIL-HYDROCHLOROTHIAZIDE 20-12.5 MG PO TABS: 1.0000 | ORAL_TABLET | Freq: Every day | ORAL | 1 refills | Status: DC

## 2018-12-12 NOTE — Progress Notes (Signed)
Subjective:   @Patient  ID@: Crystal Shea, female    DOB: 11-29-1970, 48 y.o.   MRN: [id number]  Patient referred by Lin Landsman for chest pain and family history of heart problems.   Chief Complaint  Patient presents with  . Chest Pain    pt c/o chest pain off and on, grttin worse since January     Patient with hyperlipidemia referred to [place] for evaluation of chest pain.  She reports that she has had intermittent episodes of sharp chest pain that would resolve on its own for the last few years; however, she did have one episode last month that was severe. Reports episode occurred while being under significant stress that was related to job changes. She also had flu like symptoms at the time, but tested negative for the flu. Symptoms resolved over 3 days. Symptoms do not occur with exertion.  Does reports that she has had leg swelling for several years. Was evaluated in the ER several years ago and she states that she was told that she was in heart failure. She was put on HCTZ; however, has since stopped the medication as her swelling had resolved for a period of time, but has now resolved. Right leg swells more than the left. Worse at the end of the day.  She does have hyperlipidemia and is on Lipitor and states that her last check was elevated. Does not have hypertension; however, has been elevated at last 2 doctor appts.  She does not exercise. She was previously on Phentermine; however, has stopped in view of her chest pain.   Grandmother had MI in her 23's, 1 sister died at 29 with CAD and CVA. 2 other older sisters both in early 43's with CAD.    Past Medical History:  Diagnosis Date  . Allergic   . CHF (congestive heart failure) ([place])   . Hyperlipidemia     Past Surgical History:  Procedure Laterality Date  . BLADDER REMOVAL    . CESAREAN SECTION     x2  . HAND SURGERY Left   . TUBAL LIGATION      Family History  Problem Relation Age of Onset  . Heart  disease Mother   . Hypertension Mother   . Diabetes Mother   . Heart failure Sister   . Heart failure Sister   . Heart disease Sister     Social History   Socioeconomic History  . Marital status: Single    Spouse name: Not on file  . Number of children: Not on file  . Years of education: Not on file  . Highest education level: Not on file  Occupational History  . Not on file  Social Needs  . Financial resource strain: Not on file  . Food insecurity:    Worry: Not on file    Inability: Not on file  . Transportation needs:    Medical: Not on file    Non-medical: Not on file  Tobacco Use  . Smoking status: Never Smoker  . Smokeless tobacco: Never Used  Substance and Sexual Activity  . Alcohol use: No  . Drug use: No  . Sexual activity: Not on file  Lifestyle  . Physical activity:    Days per week: Not on file    Minutes per session: Not on file  . Stress: Not on file  Relationships  . Social connections:    Talks on phone: Not on file    Gets together: Not  on file    Attends religious service: Not on file    Active member of club or organization: Not on file    Attends meetings of clubs or organizations: Not on file    Relationship status: Not on file  . Intimate partner violence:    Fear of current or ex partner: Not on file    Emotionally abused: Not on file    Physically abused: Not on file    Forced sexual activity: Not on file  Other Topics Concern  . Not on file  Social History Narrative  . Not on file    Current Outpatient Medications on File Prior to Visit  Medication Sig Dispense Refill  . cetirizine (ZYRTEC) 10 MG tablet Take 1 tablet by mouth daily.    Marland Kitchen atorvastatin (LIPITOR) 20 MG tablet Take 20 mg by mouth daily.     No current facility-administered medications on file prior to visit.      Review of Systems  Constitution: Negative for decreased appetite, malaise/fatigue, weight gain and weight loss.  Eyes: Negative for visual disturbance.   Cardiovascular: Positive for chest pain and leg swelling. Negative for claudication, dyspnea on exertion, orthopnea, palpitations and syncope.  Respiratory: Negative for hemoptysis and wheezing.   Endocrine: Negative for cold intolerance and heat intolerance.  Hematologic/Lymphatic: Does not bruise/bleed easily.  Skin: Negative for nail changes.  Musculoskeletal: Negative for muscle weakness and myalgias.  Gastrointestinal: Negative for abdominal pain, change in bowel habit, nausea and vomiting.  Neurological: Negative for difficulty with concentration, dizziness, focal weakness and headaches.  Psychiatric/Behavioral: Negative for altered mental status and suicidal ideas.  All other systems reviewed and are negative.      Objective:   Physical Exam  Constitutional: She is oriented to person, place, and time. Vital signs are normal. She appears well-developed and well-nourished.  HENT:  Head: Normocephalic and atraumatic.  Neck: Normal range of motion.  Cardiovascular: Normal rate, regular rhythm, normal heart sounds and intact distal pulses.  Pulmonary/Chest: Effort normal and breath sounds normal. No accessory muscle usage. No respiratory distress.  Abdominal: Soft. Bowel sounds are normal.  Musculoskeletal: Normal range of motion.  Neurological: She is alert and oriented to person, place, and time.  Skin: Skin is warm and dry.  Vitals reviewed.      Assessment & Recommendations:   1. Atypical chest pain Patient has had symptoms for the last several years; however, symptoms have now worsened. She has had one episode with emotional upset. She has markedly elevated lipids in the past and in view of her family history, feel that patient is high risk. I have recommended exercise nuclear stress test.   2. Bilateral leg edema She is not noted to have significant leg edema today; however, she reports is generally worse at the end of the day. Possibly related to diet, hypertension, and  weight gain. I will schedule for echocardiogram to exclude any structural abnormalities. Discussed the importance of 10-15 lb weight loss. Diet modifications and weight loss was discussed.  3. Family history of early CAD Has significant family history of early CAD. Including two older sisters, mother, and maternal grandmother.   4. Mixed hyperlipidemia Last lipids that I have reveal that her LDL was markedly elevated. Lipids were recently obtained by her PCP, will request for records. She is on lipitor 20 mg daily. Depending upon the results, she will likely need higher dose as well as possible addition of Zetia.    5. Essential hypertension Patient has recently  had elevated blood pressure and she also EKG changes suggestive of this. I will start her on Lisinopril HCTZ 20-12.5mg  daily. Will check BMP in 10 days for surveillence. Discussed that weight loss will help with controlling her blood pressure.   I will see her back after the test for further recommendations and evaluation.   Thank you for referring this patient. Please do not hesitate to contact me for any questions.    *I have discussed this case with Dr. Einar Gip and he personally examined the patient and participated in formulating the plan.*   Jeri Lager, [facility], [place] Office: [phone] Fax: [phone]

## 2018-12-14 ENCOUNTER — Encounter: Payer: Self-pay | Admitting: Cardiology

## 2018-12-14 DIAGNOSIS — R600 Localized edema: Secondary | ICD-10-CM | POA: Insufficient documentation

## 2018-12-14 DIAGNOSIS — R0789 Other chest pain: Secondary | ICD-10-CM | POA: Insufficient documentation

## 2018-12-14 DIAGNOSIS — I10 Essential (primary) hypertension: Secondary | ICD-10-CM

## 2018-12-14 DIAGNOSIS — Z8249 Family history of ischemic heart disease and other diseases of the circulatory system: Secondary | ICD-10-CM | POA: Insufficient documentation

## 2018-12-14 DIAGNOSIS — E782 Mixed hyperlipidemia: Secondary | ICD-10-CM | POA: Insufficient documentation

## 2018-12-14 HISTORY — DX: Essential (primary) hypertension: I10

## 2018-12-23 ENCOUNTER — Ambulatory Visit: Payer: BC Managed Care – PPO

## 2018-12-23 DIAGNOSIS — R0789 Other chest pain: Secondary | ICD-10-CM

## 2018-12-27 ENCOUNTER — Other Ambulatory Visit: Payer: Self-pay | Admitting: Cardiology

## 2018-12-27 ENCOUNTER — Ambulatory Visit: Payer: BC Managed Care – PPO

## 2018-12-27 ENCOUNTER — Other Ambulatory Visit: Payer: Self-pay

## 2018-12-27 DIAGNOSIS — R600 Localized edema: Secondary | ICD-10-CM

## 2018-12-28 LAB — BASIC METABOLIC PANEL
BUN/Creatinine Ratio: 19 (ref 9–23)
BUN: 16 mg/dL (ref 6–24)
CALCIUM: 9.5 mg/dL (ref 8.7–10.2)
CHLORIDE: 104 mmol/L (ref 96–106)
CO2: 26 mmol/L (ref 20–29)
Creatinine, Ser: 0.85 mg/dL (ref 0.57–1.00)
GFR calc Af Amer: 95 mL/min/{1.73_m2} (ref >59)
GFR calc non Af Amer: 82 mL/min/{1.73_m2} (ref >59)
GLUCOSE: 82 mg/dL (ref 65–99)
Potassium: 4.2 mmol/L (ref 3.5–5.2)
Sodium: 142 mmol/L (ref 134–144)

## 2019-01-02 ENCOUNTER — Telehealth: Payer: Self-pay

## 2019-01-02 NOTE — Progress Notes (Signed)
Pt aware.

## 2019-01-06 ENCOUNTER — Ambulatory Visit: Payer: BC Managed Care – PPO | Admitting: Cardiology

## 2019-01-06 ENCOUNTER — Telehealth: Payer: Self-pay

## 2019-01-06 NOTE — Telephone Encounter (Signed)
Please let her know that I will give her a call tomorrow morning likely before I start office and we can talk about her test.

## 2019-01-07 ENCOUNTER — Telehealth: Payer: BC Managed Care – PPO | Admitting: Cardiology

## 2019-01-07 DIAGNOSIS — E782 Mixed hyperlipidemia: Secondary | ICD-10-CM

## 2019-01-07 DIAGNOSIS — I10 Essential (primary) hypertension: Secondary | ICD-10-CM

## 2019-01-07 MED ORDER — LISINOPRIL-HYDROCHLOROTHIAZIDE 20-12.5 MG PO TABS: 1.0000 | ORAL_TABLET | Freq: Every day | ORAL | 3 refills | Status: DC

## 2019-01-07 NOTE — Telephone Encounter (Signed)
Test results were discussed with patient over the phone. No evidence of ischemia by stress test, considered low risk study. Had low exercise capacity. Encouraged her to increase activity level to see if this will improve. Echo unchanged from 2018, normal LVEF. Grade 1 diastolic dysfunction related to hypertension. Kidney function is normal with addition of lisinopril HCT. Continue the same. Chest pain, visual changes, and eye redness has improved since being on medication and likely related to hypertension. Will repeat lipid panel in 3 months as she is now consistently taking and see her back at that time. Encouraged home BP monitoring.

## 2019-01-23 NOTE — Telephone Encounter (Signed)
Per patient Crystal Shea spoke to her about her test and lab results over the phone.

## 2019-05-09 ENCOUNTER — Telehealth: Payer: Self-pay

## 2019-05-09 NOTE — Telephone Encounter (Signed)
Pt called and wants to know if you can send her a mychart message about her recent echo she had done, She needs you to explain it to her again please

## 2019-05-19 ENCOUNTER — Telehealth: Payer: Self-pay | Admitting: Cardiology

## 2019-05-19 NOTE — Telephone Encounter (Signed)
Discussed echo findings with patient over the phone including diastolic dysfunction and LVH all related to hypertension. I have also activated her mychart and she is requesting explanation be sent to her in a message as well.

## 2019-06-16 ENCOUNTER — Other Ambulatory Visit: Payer: Self-pay

## 2019-06-16 DIAGNOSIS — I10 Essential (primary) hypertension: Secondary | ICD-10-CM

## 2019-06-16 MED ORDER — LISINOPRIL-HYDROCHLOROTHIAZIDE 20-12.5 MG PO TABS: 1.0000 | ORAL_TABLET | Freq: Every day | ORAL | 3 refills | Status: DC

## 2019-07-14 ENCOUNTER — Encounter (HOSPITAL_COMMUNITY): Payer: Self-pay | Admitting: Emergency Medicine

## 2019-07-14 ENCOUNTER — Other Ambulatory Visit: Payer: Self-pay

## 2019-07-14 DIAGNOSIS — I110 Hypertensive heart disease with heart failure: Secondary | ICD-10-CM | POA: Insufficient documentation

## 2019-07-14 DIAGNOSIS — K529 Noninfective gastroenteritis and colitis, unspecified: Secondary | ICD-10-CM | POA: Diagnosis not present

## 2019-07-14 DIAGNOSIS — I509 Heart failure, unspecified: Secondary | ICD-10-CM | POA: Insufficient documentation

## 2019-07-14 DIAGNOSIS — Z79899 Other long term (current) drug therapy: Secondary | ICD-10-CM | POA: Diagnosis not present

## 2019-07-14 DIAGNOSIS — R109 Unspecified abdominal pain: Secondary | ICD-10-CM | POA: Diagnosis present

## 2019-07-14 LAB — CBC
HCT: 40.4 % (ref 36.0–46.0)
Hemoglobin: 13.3 g/dL (ref 12.0–15.0)
MCH: 29.4 pg (ref 26.0–34.0)
MCHC: 32.9 g/dL (ref 30.0–36.0)
MCV: 89.2 fL (ref 80.0–100.0)
Platelets: 248 10*3/uL (ref 150–400)
RBC: 4.53 MIL/uL (ref 3.87–5.11)
RDW: 13.9 % (ref 11.5–15.5)
WBC: 7.3 10*3/uL (ref 4.0–10.5)
nRBC: 0 % (ref 0.0–0.2)

## 2019-07-14 MED ORDER — SODIUM CHLORIDE 0.9% FLUSH
3.0000 mL | Freq: Once | INTRAVENOUS | Status: AC
  Administered 2019-07-15: 02:00:00 3 mL via INTRAVENOUS

## 2019-07-14 NOTE — ED Triage Notes (Signed)
Patient here from home with complaints of abd pain, nausea, vomiting, blood in stools, and chills that started at 5pm. Reports hx of diverticulitis.

## 2019-07-14 NOTE — ED Notes (Signed)
Clicked off TYPE and SCREEN by mistake. Pink tube not drawn

## 2019-07-15 ENCOUNTER — Encounter (HOSPITAL_COMMUNITY): Payer: Self-pay

## 2019-07-15 ENCOUNTER — Emergency Department (HOSPITAL_COMMUNITY): Payer: BC Managed Care – PPO

## 2019-07-15 ENCOUNTER — Emergency Department (HOSPITAL_COMMUNITY)
Admission: EM | Admit: 2019-07-15 | Discharge: 2019-07-15 | Disposition: A | Discharge status: Home / Self Care | Payer: BC Managed Care – PPO | Attending: Emergency Medicine | Admitting: Emergency Medicine

## 2019-07-15 DIAGNOSIS — K529 Noninfective gastroenteritis and colitis, unspecified: Secondary | ICD-10-CM

## 2019-07-15 LAB — URINALYSIS, ROUTINE W REFLEX MICROSCOPIC
Bilirubin Urine: NEGATIVE
Glucose, UA: NEGATIVE mg/dL
Ketones, ur: NEGATIVE mg/dL
Leukocytes,Ua: NEGATIVE
Nitrite: NEGATIVE
Protein, ur: NEGATIVE mg/dL
Specific Gravity, Urine: 1.02 (ref 1.005–1.030)
pH: 5 (ref 5.0–8.0)

## 2019-07-15 LAB — COMPREHENSIVE METABOLIC PANEL
ALT: 19 U/L (ref 0–44)
AST: 19 U/L (ref 15–41)
Albumin: 3.8 g/dL (ref 3.5–5.0)
Alkaline Phosphatase: 90 U/L (ref 38–126)
Anion gap: 11 (ref 5–15)
BUN: 13 mg/dL (ref 6–20)
CO2: 26 mmol/L (ref 22–32)
Calcium: 9.3 mg/dL (ref 8.9–10.3)
Chloride: 104 mmol/L (ref 98–111)
Creatinine, Ser: 0.84 mg/dL (ref 0.44–1.00)
GFR calc Af Amer: >60 mL/min (ref >60)
GFR calc non Af Amer: >60 mL/min (ref >60)
Glucose, Bld: 110 mg/dL — ABNORMAL HIGH (ref 70–99)
Potassium: 4 mmol/L (ref 3.5–5.1)
Sodium: 141 mmol/L (ref 135–145)
Total Bilirubin: 0.7 mg/dL (ref 0.3–1.2)
Total Protein: 8 g/dL (ref 6.5–8.1)

## 2019-07-15 LAB — HCG, QUANTITATIVE, PREGNANCY: hCG, Beta Chain, Quant, S: 2 m[IU]/mL (ref <5)

## 2019-07-15 LAB — LIPASE, BLOOD: Lipase: 20 U/L (ref 11–51)

## 2019-07-15 MED ORDER — OXYCODONE-ACETAMINOPHEN 5-325 MG PO TABS: 1.0000 | ORAL_TABLET | ORAL | 0 refills | Status: DC | PRN

## 2019-07-15 MED ORDER — METRONIDAZOLE 500 MG PO TABS: 500.0000 mg | ORAL_TABLET | Freq: Two times a day (BID) | ORAL | 0 refills | Status: DC

## 2019-07-15 MED ORDER — IOHEXOL 300 MG/ML  SOLN
100.0000 mL | Freq: Once | INTRAMUSCULAR | Status: AC | PRN
  Administered 2019-07-15: 100 mL via INTRAVENOUS

## 2019-07-15 MED ORDER — METRONIDAZOLE 500 MG PO TABS
500.0000 mg | ORAL_TABLET | Freq: Once | ORAL | Status: AC
  Administered 2019-07-15: 500 mg via ORAL
  Filled 2019-07-15: qty 1

## 2019-07-15 MED ORDER — SODIUM CHLORIDE 0.9 % IV BOLUS
1000.0000 mL | Freq: Once | INTRAVENOUS | Status: AC
  Administered 2019-07-15: 1000 mL via INTRAVENOUS

## 2019-07-15 MED ORDER — SODIUM CHLORIDE (PF) 0.9 % IJ SOLN
INTRAMUSCULAR | Status: AC
  Filled 2019-07-15: qty 50

## 2019-07-15 MED ORDER — ONDANSETRON HCL 4 MG/2ML IJ SOLN
4.0000 mg | Freq: Once | INTRAMUSCULAR | Status: AC
  Administered 2019-07-15: 02:00:00 4 mg via INTRAVENOUS
  Filled 2019-07-15: qty 2

## 2019-07-15 MED ORDER — CIPROFLOXACIN HCL 500 MG PO TABS: 500.0000 mg | ORAL_TABLET | Freq: Two times a day (BID) | ORAL | 0 refills | Status: DC

## 2019-07-15 MED ORDER — MORPHINE SULFATE (PF) 4 MG/ML IV SOLN
4.0000 mg | Freq: Once | INTRAVENOUS | Status: AC
  Administered 2019-07-15: 02:00:00 4 mg via INTRAVENOUS
  Filled 2019-07-15: qty 1

## 2019-07-15 MED ORDER — ONDANSETRON 4 MG PO TBDP: 4.0000 mg | ORAL_TABLET | Freq: Three times a day (TID) | ORAL | 0 refills | Status: DC | PRN

## 2019-07-15 MED ORDER — CIPROFLOXACIN HCL 500 MG PO TABS
500.0000 mg | ORAL_TABLET | Freq: Once | ORAL | Status: AC
  Administered 2019-07-15: 03:00:00 500 mg via ORAL
  Filled 2019-07-15: qty 1

## 2019-07-15 NOTE — Discharge Instructions (Signed)
Take the prescribed medication as directed. Follow-up with your GI doctor.  You will need to have finished your treatment for colitis and be fully covered before they will be able to do your repeat colonoscopy. Return to the ED for new or worsening symptoms.

## 2019-07-15 NOTE — ED Provider Notes (Signed)
[facility] DEPT Provider Note   CSN: [id number] Arrival date & time: 07/14/19  2205     History   Chief Complaint Chief Complaint  Patient presents with  . Abdominal Pain  . Blood In Stools  . Nausea  . Emesis    HPI Crystal Shea is a 48 y.o. female.     The history is provided by the patient and medical records.  Abdominal Pain Associated symptoms: diarrhea, nausea and vomiting   Emesis Associated symptoms: abdominal pain and diarrhea      48 year old female with history of CHF, hypertension, hyperlipidemia, presenting to the ED with abdominal pain, nausea, vomiting, diarrhea, and blood in the stool.  States symptoms began this afternoon at work.  States she is just started feeling very hot and flushed and began throwing up in her office.  She went to the bathroom and immediately started having loose, watery diarrhea.  This is persisted throughout the evening, now having bright red blood in her watery stools.  States she is feeling weaker as the night has gone on.  She still denies any fever.  No urinary symptoms.  She denies any recent travel, sick contacts, or changes in diet.  She reports history of diverticulitis in the past with similar symptoms.  Past Medical History:  Diagnosis Date  . Allergic   . CHF (congestive heart failure) ([place])   . Essential hypertension 12/14/2018  . Hyperlipidemia     Patient Active Problem List   Diagnosis Date Noted  . Atypical chest pain 12/14/2018  . Bilateral leg edema 12/14/2018  . Family history of early CAD 12/14/2018  . Mixed hyperlipidemia 12/14/2018  . Essential hypertension 12/14/2018    Past Surgical History:  Procedure Laterality Date  . BLADDER REMOVAL    . CESAREAN SECTION     x2  . HAND SURGERY Left   . TUBAL LIGATION       OB History   No obstetric history on file.      Home Medications    Prior to Admission medications   Medication Sig Start Date End Date Taking?  Authorizing Provider  atorvastatin (LIPITOR) 20 MG tablet Take 20 mg by mouth daily.    [provider]  cetirizine (ZYRTEC) 10 MG tablet Take 1 tablet by mouth daily. 01/24/17   [provider]  lisinopril-hydrochlorothiazide (ZESTORETIC) 20-12.5 MG tablet Take 1 tablet by mouth daily. 06/16/19   Miquel Dunn, NP    Family History Family History  Problem Relation Age of Onset  . Heart disease Mother   . Hypertension Mother   . Diabetes Mother   . Heart failure Sister   . Heart failure Sister   . Heart disease Sister     Social History Social History   Tobacco Use  . Smoking status: Never Smoker  . Smokeless tobacco: Never Used  Substance Use Topics  . Alcohol use: No  . Drug use: No     Allergies   Patient has no known allergies.   Review of Systems Review of Systems  Gastrointestinal: Positive for abdominal pain, blood in stool, diarrhea, nausea and vomiting.  All other systems reviewed and are negative.    Physical Exam Updated Vital Signs BP (!) 137/99 (BP Location: Right Arm)   Pulse 83   Temp 98.7 F (37.1 C) (Oral)   Resp 16   Ht 5\' 2"  (1.575 m)   Wt 84.8 kg   LMP 02/03/2015 (Approximate)   SpO2 99%  BMI 34.20 kg/m   Physical Exam Vitals signs and nursing note reviewed.  Constitutional:      Appearance: She is well-developed.     Comments: Appears uncomfortable but nontoxic  HENT:     Head: Normocephalic and atraumatic.  Eyes:     Conjunctiva/sclera: Conjunctivae normal.     Pupils: Pupils are equal, round, and reactive to light.  Neck:     Musculoskeletal: Normal range of motion.  Cardiovascular:     Rate and Rhythm: Normal rate and regular rhythm.     Heart sounds: Normal heart sounds.  Pulmonary:     Effort: Pulmonary effort is normal.     Breath sounds: Normal breath sounds.  Abdominal:     General: Bowel sounds are normal.     Palpations: Abdomen is soft.     Tenderness: There is abdominal tenderness in  the right lower quadrant and left lower quadrant.     Comments: Mild tenderness in the lower abdomen, no peritoneal signs  Musculoskeletal: Normal range of motion.  Skin:    General: Skin is warm and dry.  Neurological:     Mental Status: She is alert and oriented to person, place, and time.      ED Treatments / Results  Labs (all labs ordered are listed, but only abnormal results are displayed) Labs Reviewed  COMPREHENSIVE METABOLIC PANEL - Abnormal; Notable for the following components:      Result Value   Glucose, Bld 110 (*)    All other components within normal limits  URINALYSIS, ROUTINE W REFLEX MICROSCOPIC - Abnormal; Notable for the following components:   Hgb urine dipstick SMALL (*)    Bacteria, UA RARE (*)    All other components within normal limits  LIPASE, BLOOD  CBC  HCG, QUANTITATIVE, PREGNANCY  POC OCCULT BLOOD, ED  TYPE AND SCREEN    EKG None  Radiology Ct Abdomen Pelvis W Contrast  Result Date: 07/15/2019 CLINICAL DATA:  48 year old female with lower abdominal pain, vomiting and blood in stool. EXAM: CT ABDOMEN AND PELVIS WITH CONTRAST TECHNIQUE: Multidetector CT imaging of the abdomen and pelvis was performed using the standard protocol following bolus administration of intravenous contrast. CONTRAST:  161mL OMNIPAQUE IOHEXOL 300 MG/ML  SOLN COMPARISON:  CT Abdomen and Pelvis 03/21/2015. FINDINGS: Lower chest: Negative. Hepatobiliary: Surgically absent gallbladder. Stable liver with a small 12 millimeter benign appearing hypodense area in the right lobe on series 2, image 17. No bile duct enlargement. Pancreas: Negative. Spleen: Negative. Adrenals/Urinary Tract: Normal adrenal glands. Bilateral renal enhancement and contrast excretion is symmetric and normal. Normal proximal ureters. No nephrolithiasis. Diminutive and unremarkable urinary bladder. Stomach/Bowel: The rectum is decompressed and negative. The proximal sigmoid colon appears indistinct and  thick-walled on series 2, image 58. That appearance continues into the distal descending colon, although the proximal descending colon and splenic flexure have a more normal appearance. The transverse colon is also decompressed and mild wall thickening there is difficult to exclude (series 2, image 30). The right colon appears relatively spared, and a normal appendix is visible on coronal image 62. The terminal ileum and distal small bowel loops are within normal limits. There are fluid-filled but nondilated other small bowel loops in the abdomen. No small bowel mesenteric stranding. Negative stomach and duodenum. No free air. No free fluid. Vascular/Lymphatic: Major arterial structures are patent with minimal atherosclerosis. Portal venous system is patent. No lymphadenopathy. Reproductive: Round 4 centimeter intramural dorsal uterine fibroid again suspected (sagittal image 78). Persistent but smaller,  now 2 centimeter rounded fluid collection which seems associated with the endometrium at the uterine fundus (series 2, image 61). Negative ovaries. Other: No pelvic free fluid. Musculoskeletal: No acute osseous abnormality identified. IMPRESSION: 1. Appearance suspicious for Mild Acute Colitis, most pronounced in the proximal sigmoid colon but possibly also involving the transverse and descending segments. The right colon and appendix appear normal. No free fluid or complicating features. 2. Chronic 4 cm dorsal submucosal or intramural uterine fibroid suspected along with a small area of what may be trapped endometrial fluid at the uterine fundus. Electronically Signed   By: Genevie Ann M.D.   On: 07/15/2019 02:19    Procedures Procedures (including critical care time)  Medications Ordered in ED Medications  sodium chloride (PF) 0.9 % injection (has no administration in time range)  ciprofloxacin (CIPRO) tablet 500 mg (has no administration in time range)  metroNIDAZOLE (FLAGYL) tablet 500 mg (has no  administration in time range)  sodium chloride flush (NS) 0.9 % injection 3 mL (3 mLs Intravenous Given 07/15/19 0133)  morphine 4 MG/ML injection 4 mg (4 mg Intravenous Given 07/15/19 0134)  ondansetron (ZOFRAN) injection 4 mg (4 mg Intravenous Given 07/15/19 0134)  sodium chloride 0.9 % bolus 1,000 mL (1,000 mLs Intravenous New Bag/Given 07/15/19 0133)  iohexol (OMNIPAQUE) 300 MG/ML solution 100 mL (100 mLs Intravenous Contrast Given 07/15/19 0146)     Initial Impression / Assessment and Plan / ED Course  I have reviewed the triage vital signs and the nursing notes.  Pertinent labs & imaging results that were available during my care of the patient were reviewed by me and considered in my medical decision making (see chart for details).  48 year old female here with abdominal pain, nausea, vomiting, diarrhea, and bloody stools that began today.  She is afebrile and nontoxic.  Does have some tenderness in the lower abdomen but no peritoneal signs.  Her labs are overall reassuring.  UA without any signs of infection.  We will plan for CT scan as she does have history of diverticulitis in the past.  She is IV fluids, Zofran, and morphine.  Will reassess.  2:45 AM After medications here patient is feeling better.  No further vomiting or diarrhea episodes.  VS remain stable.  CT with findings of uncomplicated colitis.  No other acute findings.  Patient given PO abx here, tolerated well, so will d/c home with same.  States she is due for colonoscopy-- advised to follow-up with GI once abx completed and schedule this.  Can also follow-up with PCP.  Return here for any new/acute changes.  Final Clinical Impressions(s) / ED Diagnoses   Final diagnoses:  Colitis    ED Discharge Orders         Ordered    ciprofloxacin (CIPRO) 500 MG tablet  Every 12 hours     07/15/19 0252    metroNIDAZOLE (FLAGYL) 500 MG tablet  2 times daily     07/15/19 0252    oxyCODONE-acetaminophen (PERCOCET) 5-325 MG tablet   Every 4 hours PRN     07/15/19 0252    ondansetron (ZOFRAN ODT) 4 MG disintegrating tablet  Every 8 hours PRN     07/15/19 0252           Larene Pickett, PA-C 07/15/19 [id number]    Merryl Hacker, MD 07/15/19 [phone]

## 2019-10-02 ENCOUNTER — Ambulatory Visit: Payer: BC Managed Care – PPO | Attending: Internal Medicine

## 2019-10-02 DIAGNOSIS — Z20822 Contact with and (suspected) exposure to covid-19: Secondary | ICD-10-CM

## 2019-10-03 LAB — NOVEL CORONAVIRUS, NAA: SARS-CoV-2, NAA: NOT DETECTED

## 2019-10-07 ENCOUNTER — Other Ambulatory Visit: Payer: Self-pay

## 2019-10-07 DIAGNOSIS — I10 Essential (primary) hypertension: Secondary | ICD-10-CM

## 2019-10-07 MED ORDER — LISINOPRIL-HYDROCHLOROTHIAZIDE 20-12.5 MG PO TABS: 1.0000 | ORAL_TABLET | Freq: Every day | ORAL | 3 refills | Status: DC

## 2019-12-25 ENCOUNTER — Ambulatory Visit: Payer: BC Managed Care – PPO | Attending: Family

## 2019-12-25 DIAGNOSIS — Z23 Encounter for immunization: Secondary | ICD-10-CM

## 2019-12-25 NOTE — Progress Notes (Signed)
   Covid-19 Vaccination Clinic  Name:  Crystal Shea    MRN: [id number] DOB: 01/26/1971  12/25/2019  [name] was observed post Covid-19 immunization for 15 minutes without incident. She was provided with Vaccine Information Sheet and instruction to access the V-Safe system.   [name] was instructed to call 911 with any severe reactions post vaccine: Marland Kitchen Difficulty breathing  . Swelling of face and throat  . A fast heartbeat  . A bad rash all over body  . Dizziness and weakness   Immunizations Administered    Name Date Dose VIS Date Route   Moderna COVID-19 Vaccine 12/25/2019  1:03 PM 0.5 mL 09/16/2019 Intramuscular   Manufacturer: Moderna   Lot: [id number]   [id number]

## 2020-01-27 ENCOUNTER — Ambulatory Visit: Payer: BC Managed Care – PPO | Attending: Family

## 2020-01-27 DIAGNOSIS — Z23 Encounter for immunization: Secondary | ICD-10-CM

## 2020-01-27 NOTE — Progress Notes (Signed)
   Covid-19 Vaccination Clinic  Name:  Crystal Shea    MRN: [id number] DOB: Jul 28, 1971  01/27/2020  [name] was observed post Covid-19 immunization for 15 minutes without incident. She was provided with Vaccine Information Sheet and instruction to access the V-Safe system.   [name] was instructed to call 911 with any severe reactions post vaccine: Marland Kitchen Difficulty breathing  . Swelling of face and throat  . A fast heartbeat  . A bad rash all over body  . Dizziness and weakness   Immunizations Administered    Name Date Dose VIS Date Route   Moderna COVID-19 Vaccine 01/27/2020 10:23 AM 0.5 mL 09/16/2019 Intramuscular   Manufacturer: Moderna   Lot: [id number]   [id number]

## 2020-02-18 ENCOUNTER — Other Ambulatory Visit: Payer: Self-pay

## 2020-02-18 DIAGNOSIS — I10 Essential (primary) hypertension: Secondary | ICD-10-CM

## 2020-02-18 MED ORDER — LISINOPRIL-HYDROCHLOROTHIAZIDE 20-12.5 MG PO TABS: 1.0000 | ORAL_TABLET | Freq: Every day | ORAL | 3 refills | Status: DC

## 2020-02-20 ENCOUNTER — Other Ambulatory Visit: Payer: Self-pay | Admitting: Cardiology

## 2020-02-20 DIAGNOSIS — I10 Essential (primary) hypertension: Secondary | ICD-10-CM

## 2020-02-24 NOTE — Progress Notes (Signed)
Primary Physician/Referring:  Lin Landsman, MD  Patient ID: Crystal Shea, female    DOB: March 20, 1971, 49 y.o.   MRN: [id number]  Chief Complaint  Patient presents with  . Follow-up    1 year/ Medication management  . Hyperlipidemia  . Hypertension   HPI:    Crystal Shea  is a 49 y.o. with hyperlipidemia, hypertension, but a strong family history of premature coronary artery disease in which grandmother had MI in her 63s, 1 sister died at age 25 with CAD and CVA and 2 older sisters both in their 27s had coronary artery disease diagnosis.  This is her annual visit. She has not had any chest pain, she is tolerating all her medications well and states that her blood pressure has been well controlled. She remains asymptomatic except for occasional leg edema.   Past Medical History:  Diagnosis Date  . Allergic   . CHF (congestive heart failure) ([place])   . Essential hypertension 12/14/2018  . Hyperlipidemia    Past Surgical History:  Procedure Laterality Date  . BLADDER REMOVAL    . CESAREAN SECTION     x2  . HAND SURGERY Left   . TUBAL LIGATION     Family History  Problem Relation Age of Onset  . Heart disease Mother   . Hypertension Mother   . Diabetes Mother   . Heart failure Sister   . Heart failure Sister   . Heart disease Sister     Social History   Tobacco Use  . Smoking status: Never Smoker  . Smokeless tobacco: Never Used  Substance Use Topics  . Alcohol use: No   Marital Status: Single  ROS  Review of Systems  Constitution: Negative for weight gain.  Cardiovascular: Positive for leg swelling. Negative for chest pain and dyspnea on exertion.  Respiratory: Negative for shortness of breath.   Musculoskeletal: Negative for joint swelling.   Objective  Blood pressure 119/71, pulse 64, temperature 98.4 F (36.9 C), temperature source Temporal, height _0  (1.575 m), weight 191 lb (86.6 kg), last menstrual period 02/03/2015, SpO2 99 %.  Vitals with BMI  02/26/2020 07/15/2019 07/15/2019  Height _1  - -  Weight 191 lbs - -  BMI 03.88 - -  Systolic 828 003 491  Diastolic 71 70 60  Pulse 64 65 64     Physical Exam  Constitutional: No distress.  Moderately built and mildly obese in no acute distress  Cardiovascular: Normal rate, regular rhythm and intact distal pulses. Exam reveals no gallop.  No murmur heard. No leg edema. No JVD.    Pulmonary/Chest: Effort normal and breath sounds normal. No accessory muscle usage. No respiratory distress.  Abdominal: Soft.   Laboratory examination:   Recent Labs    07/14/19 2333  NA 141  K 4.0  CL 104  CO2 26  GLUCOSE 110*  BUN 13  CREATININE 0.84  CALCIUM 9.3  GFRNONAA >60  GFRAA >60   CrCl cannot be calculated (Patient's most recent lab result is older than the maximum 21 days allowed.).  CMP Latest Ref Rng & Units 07/14/2019 12/27/2018 03/21/2015  Glucose 70 - 99 mg/dL 110(H) 82 98  BUN 6 - 20 mg/dL _2 Creatinine 0.44 - 1.00 mg/dL 0.84 0.85 0.81  Sodium 135 - 145 mmol/L 141 142 141  Potassium 3.5 - 5.1 mmol/L 4.0 4.2 3.4(L)  Chloride 98 - 111 mmol/L 104 104 106  CO2 22 - 32 mmol/L 26 26 29  Calcium 8.9 - 10.3 mg/dL 9.3 9.5 8.7(L)  Total Protein 6.5 - 8.1 g/dL 8.0 - 7.7  Total Bilirubin 0.3 - 1.2 mg/dL 0.7 - 0.3  Alkaline Phos 38 - 126 U/L 90 - 76  AST 15 - 41 U/L 19 - 16  ALT 0 - 44 U/L 19 - 11(L)   CBC Latest Ref Rng & Units 07/14/2019 03/21/2015 03/06/2011  WBC 4.0 - 10.5 K/uL 7.3 5.7 7.0  Hemoglobin 12.0 - 15.0 g/dL 13.3 13.1 11.8(L)  Hematocrit 36.0 - 46.0 % 40.4 39.3 35.5(L)  Platelets 150 - 400 K/uL 248 235 289   Lipid Panel  No results found for: CHOL, TRIG, HDL, CHOLHDL, VLDL, LDLCALC, LDLDIRECT HEMOGLOBIN A1C No results found for: HGBA1C, MPG TSH No results for input(s): TSH in the last 8760 hours.  External labs:   12/11/2015:  TSH 1.170   08/12/2016: Cholesterol, Total 262 (H) Triglycerides 80  HDL 55  VLDL Cholesterol Cal 16  LDL 191  (H)    Medications and allergies  No Known Allergies   Current Outpatient Medications  Medication Instructions  . atorvastatin (LIPITOR) 20 mg, Oral, Daily  . cetirizine (ZYRTEC) 10 MG tablet 1 tablet, Oral, Daily PRN  . fluticasone (FLONASE) 50 MCG/ACT nasal spray Each Nare, Daily  . lisinopril-hydrochlorothiazide (ZESTORETIC) 20-12.5 MG tablet TAKE 1 TABLET BY MOUTH EVERY DAY  . olopatadine (PATANOL) 0.1 % ophthalmic solution 1 drop, Both Eyes, 2 times daily   Radiology:   No results found.  Cardiac Studies:   Exercise Sestamibi Stress Test 12/23/2018: 1. Resting EKG demonstrates normal sinus rhythm.  Stress EKG is equivocal for ischemia.  Peak exercise there was 1.5 mm upsloping ST segment depression which is back to baseline at less than 2 minutes into recovery.  Stress was terminated due to achieving 89% of MPHR and fatigue.  Patient exercised for 5:00 minutes and achieved 7.03 mets.  Accelerated heart rate response suggest aerobic intolerance.  Exercise tolerance is low for age. 2. Myocardial perfusion imaging is normal. Overall left ventricular systolic function was normal without regional wall motion abnormalities. The left ventricular ejection fraction was 58%.  Low risk study.  Echocardiogram 12/27/2018:  Left ventricle cavity is normal in size. Mild concentric hypertrophy of the left ventricle. Normal global wall motion. Doppler evidence of grade I (impaired) diastolic dysfunction, normal LAP. Calculated EF 60%.  No significant valvular abnormalities.  Inadequate TR jet to estimate pulmonary artery systolic pressure. Normal right atrial pressure.  No significant change compared to previous study on 04/03/2017.   EKG  EKG 02/26/2020: Normal sinus rhythm with rate of 62 bpm, normal axis.  Nonspecific T abnormality.   No significant change from 12/12/2018, LVH criteria not met today.   Assessment     ICD-10-CM   1. Essential hypertension  I10 EKG 12-Lead    CMP14+EGFR     TSH  2. Pure hypercholesterolemia  E78.00 Lipid Panel With LDL/HDL Ratio  3. Family history of early CAD  Z82.49      No orders of the defined types were placed in this encounter.   Medications Discontinued During This Encounter  Medication Reason  . ciprofloxacin (CIPRO) 500 MG tablet Completed Course  . metroNIDAZOLE (FLAGYL) 500 MG tablet Completed Course  . ondansetron (ZOFRAN ODT) 4 MG disintegrating tablet Completed Course  . oxyCODONE-acetaminophen (PERCOCET) 5-325 MG tablet Completed Course    Recommendations:   Crystal Shea  is a 49 y.o.  with hyperlipidemia, hypertension, but a strong family history of premature coronary  artery disease in which grandmother had MI in her 88s, 1 sister died at age 31 with CAD and CVA and 2 older sisters both in their 11s had coronary artery disease diagnosis.  She remains asymptomatic, no significant change in her physical exam and EKG is essentially normal with minor T wave abnormality. Blood pressures well controlled. She needs labs for follow-up of her lipids, I have discussed with her regarding lifestyle changes and also primary prevention extensively. I did not make any changes to her medications, I will see her back in a year.  Adrian Prows, MD, [facility] 02/26/2020, 9:23 AM [facility] [place] Pager: [phone] Office: [phone]

## 2020-02-26 ENCOUNTER — Other Ambulatory Visit: Payer: Self-pay

## 2020-02-26 ENCOUNTER — Ambulatory Visit: Payer: BC Managed Care – PPO | Admitting: Cardiology

## 2020-02-26 ENCOUNTER — Encounter: Payer: Self-pay | Admitting: Cardiology

## 2020-02-26 VITALS — BP 119/71 | HR 64 | Temp 98.4°F | Ht 62.0 in | Wt 191.0 lb

## 2020-02-26 DIAGNOSIS — E7800 Pure hypercholesterolemia, unspecified: Secondary | ICD-10-CM

## 2020-02-26 DIAGNOSIS — I10 Essential (primary) hypertension: Secondary | ICD-10-CM

## 2020-02-26 DIAGNOSIS — Z8249 Family history of ischemic heart disease and other diseases of the circulatory system: Secondary | ICD-10-CM

## 2020-02-26 DIAGNOSIS — E042 Nontoxic multinodular goiter: Secondary | ICD-10-CM | POA: Insufficient documentation

## 2020-02-26 DIAGNOSIS — N951 Menopausal and female climacteric states: Secondary | ICD-10-CM | POA: Insufficient documentation

## 2020-02-26 DIAGNOSIS — N926 Irregular menstruation, unspecified: Secondary | ICD-10-CM | POA: Insufficient documentation

## 2020-03-06 LAB — CMP14+EGFR
ALT: 13 IU/L (ref 0–32)
AST: 20 IU/L (ref 0–40)
Albumin/Globulin Ratio: 1.1 — ABNORMAL LOW (ref 1.2–2.2)
Albumin: 3.9 g/dL (ref 3.8–4.8)
Alkaline Phosphatase: 110 IU/L (ref 48–121)
BUN/Creatinine Ratio: 16 (ref 9–23)
BUN: 15 mg/dL (ref 6–24)
Bilirubin Total: 0.5 mg/dL (ref 0.0–1.2)
CO2: 25 mmol/L (ref 20–29)
Calcium: 9.5 mg/dL (ref 8.7–10.2)
Chloride: 104 mmol/L (ref 96–106)
Creatinine, Ser: 0.95 mg/dL (ref 0.57–1.00)
GFR calc Af Amer: 82 mL/min/{1.73_m2} (ref >59)
GFR calc non Af Amer: 72 mL/min/{1.73_m2} (ref >59)
Globulin, Total: 3.6 g/dL (ref 1.5–4.5)
Glucose: 97 mg/dL (ref 65–99)
Potassium: 4.6 mmol/L (ref 3.5–5.2)
Sodium: 145 mmol/L — ABNORMAL HIGH (ref 134–144)
Total Protein: 7.5 g/dL (ref 6.0–8.5)

## 2020-03-06 LAB — LIPID PANEL WITH LDL/HDL RATIO
Cholesterol, Total: 191 mg/dL (ref 100–199)
HDL: 58 mg/dL (ref >39)
LDL Chol Calc (NIH): 123 mg/dL — ABNORMAL HIGH (ref 0–99)
LDL/HDL Ratio: 2.1 ratio (ref 0.0–3.2)
Triglycerides: 53 mg/dL (ref 0–149)
VLDL Cholesterol Cal: 10 mg/dL (ref 5–40)

## 2020-03-06 LAB — TSH: TSH: 0.61 u[IU]/mL (ref 0.450–4.500)

## 2020-04-26 ENCOUNTER — Telehealth: Payer: Self-pay | Admitting: Cardiology

## 2020-04-26 NOTE — Telephone Encounter (Signed)
Does this appt need to be ASAP? Please advise.

## 2020-04-26 NOTE — Telephone Encounter (Signed)
Call me tomorrow about this

## 2020-04-29 NOTE — Telephone Encounter (Signed)
Called patient, NA, LMAM

## 2020-04-30 NOTE — Telephone Encounter (Signed)
So, do you want her to have an appt soon? Please advise.

## 2020-05-13 ENCOUNTER — Other Ambulatory Visit: Payer: Self-pay | Admitting: Cardiology

## 2020-05-13 DIAGNOSIS — I10 Essential (primary) hypertension: Secondary | ICD-10-CM

## 2020-08-19 ENCOUNTER — Ambulatory Visit
Admission: EM | Admit: 2020-08-19 | Discharge: 2020-08-19 | Disposition: A | Discharge status: Home / Self Care | Payer: BC Managed Care – PPO | Attending: Family | Admitting: Family

## 2020-08-19 ENCOUNTER — Ambulatory Visit (INDEPENDENT_AMBULATORY_CARE_PROVIDER_SITE_OTHER): Payer: BC Managed Care – PPO

## 2020-08-19 ENCOUNTER — Other Ambulatory Visit: Payer: Self-pay

## 2020-08-19 DIAGNOSIS — J3489 Other specified disorders of nose and nasal sinuses: Secondary | ICD-10-CM

## 2020-08-19 DIAGNOSIS — R059 Cough, unspecified: Secondary | ICD-10-CM | POA: Diagnosis not present

## 2020-08-19 DIAGNOSIS — R071 Chest pain on breathing: Secondary | ICD-10-CM | POA: Diagnosis not present

## 2020-08-19 DIAGNOSIS — R0789 Other chest pain: Secondary | ICD-10-CM | POA: Diagnosis not present

## 2020-08-19 DIAGNOSIS — R0981 Nasal congestion: Secondary | ICD-10-CM | POA: Diagnosis not present

## 2020-08-19 MED ORDER — AMOXICILLIN-POT CLAVULANATE 875-125 MG PO TABS: 1.0000 | ORAL_TABLET | Freq: Two times a day (BID) | ORAL | 0 refills | Status: AC

## 2020-08-19 NOTE — ED Triage Notes (Signed)
Pt states she began having a cough about a week ago and it initially started as cough but has developed into some pressure/pain with deep breaths and is now more of a burning sensation when she coughs and she gets winded easier and is aox4 and ambulatory.

## 2020-08-19 NOTE — Discharge Instructions (Addendum)
Recommend start Augmentin 875mg  twice a day as directed. May take Tylenol 1000mg  or Ibuprofen 600mg  every 8 hours as needed for headache. Continue to push fluids to help loosen up mucus in chest. May take OTC Mucinex or Delsym as directed for cough. Follow-up pending COVID 19 test results and in 4 to 5 days if not improving.

## 2020-08-19 NOTE — ED Provider Notes (Signed)
[facility]    CSN: [id number] Arrival date & time: 08/19/20  1231      History   Chief Complaint Chief Complaint  Patient presents with   Cough    x 1 week ago    HPI Crystal Shea is a 49 y.o. female.   49 year old female presents with cough for over 1 week. Started with some nasal congestion and slight cough but now cough is getting worse with more central chest discomfort and burning. No distinct fever but has felt warm and still some nasal drainage. Denies any sore throat or GI symptoms. Today developed a frontal headache. Has taken Mucinex with some relief. No known exposure to COVID 19. Has been vaccinated. Had COVID 19 infection in Nov 2020 and does not feel similar. Other chronic health issues include HTN, hyperlipidemia and seasonal allergies. Currently on Zestoretic and Lipitor daily.   The history is provided by the patient.    Past Medical History:  Diagnosis Date   Allergic    CHF (congestive heart failure) ([place])    Essential hypertension 12/14/2018   Hyperlipidemia     Patient Active Problem List   Diagnosis Date Noted   Irregular periods 02/26/2020   Menopausal symptom 02/26/2020   Multiple thyroid nodules 02/26/2020   Atypical chest pain 12/14/2018   Bilateral leg edema 12/14/2018   Family history of early CAD 12/14/2018   Essential hypertension 12/14/2018   Obesity (BMI 30.0-34.9) 01/26/2017   Edema 02/04/2014   Hoarseness 02/04/2014   Seasonal allergic rhinitis 02/04/2014   Seasonal allergies 02/04/2014   Mixed hyperlipidemia 12/11/2012    Past Surgical History:  Procedure Laterality Date   BLADDER REMOVAL     CESAREAN SECTION     x2   HAND SURGERY Left    TUBAL LIGATION      OB History   No obstetric history on file.      Home Medications    Prior to Admission medications   Medication Sig Start Date End Date Taking? Authorizing Provider  atorvastatin (LIPITOR) 20 MG tablet Take 20 mg by mouth  daily.   Yes [provider]  lisinopril-hydrochlorothiazide (ZESTORETIC) 20-12.5 MG tablet TAKE 1 TABLET BY MOUTH EVERY DAY 05/13/20  Yes Adrian Prows, MD  amoxicillin-clavulanate (AUGMENTIN) 875-125 MG tablet Take 1 tablet by mouth every 12 (twelve) hours for 7 days. 08/19/20 08/26/20  Katy Apo, NP  cetirizine (ZYRTEC) 10 MG tablet Take 1 tablet by mouth daily as needed for allergies.  01/24/17   [provider]  fluticasone (FLONASE) 50 MCG/ACT nasal spray Place into both nostrils daily.    [provider]    Family History Family History  Problem Relation Age of Onset   Heart disease Mother    Hypertension Mother    Diabetes Mother    Heart failure Sister    Heart failure Sister    Heart disease Sister     Social History Social History   Tobacco Use   Smoking status: Never Smoker   Smokeless tobacco: Never Used  Scientific laboratory technician Use: Never used  Substance Use Topics   Alcohol use: No   Drug use: No     Allergies   Patient has no known allergies.   Review of Systems Review of Systems  Constitutional: Positive for fatigue. Negative for activity change, appetite change, chills and diaphoresis.  HENT: Positive for congestion, postnasal drip and rhinorrhea. Negative for ear discharge, ear pain, facial swelling, mouth sores,  nosebleeds, sinus pressure, sinus pain, sore throat and trouble swallowing.   Eyes: Negative for pain, discharge, redness and itching.  Respiratory: Positive for cough and chest tightness. Negative for wheezing.   Cardiovascular: Positive for chest pain (more burning when breathing).  Gastrointestinal: Negative for diarrhea, nausea and vomiting.  Musculoskeletal: Negative for arthralgias, myalgias, neck pain and neck stiffness.  Skin: Negative for color change and rash.  Allergic/Immunologic: Positive for environmental allergies. Negative for food allergies and immunocompromised state.  Neurological:  Positive for headaches. Negative for dizziness, tremors, seizures, syncope, facial asymmetry, weakness, light-headedness and numbness.  Hematological: Negative for adenopathy. Does not bruise/bleed easily.     Physical Exam Triage Vital Signs ED Triage Vitals  Enc Vitals Group     BP 08/19/20 1239 125/76     Pulse Rate 08/19/20 1239 70     Resp 08/19/20 1239 17     Temp 08/19/20 1239 99 F (37.2 C)     Temp Source 08/19/20 1239 Oral     SpO2 08/19/20 1239 96 %     Weight --      Height --      Head Circumference --      Peak Flow --      Pain Score 08/19/20 1242 0     Pain Loc --      Pain Edu? --      Excl. in [place]? --    No data found.  Updated Vital Signs BP 125/76 (BP Location: Left Arm)    Pulse 70    Temp 99 F (37.2 C) (Oral)    Resp 17    LMP 02/03/2015 (Approximate)    SpO2 96%   Visual Acuity Right Eye Distance:   Left Eye Distance:   Bilateral Distance:    Right Eye Near:   Left Eye Near:    Bilateral Near:     Physical Exam Vitals and nursing note reviewed.  Constitutional:      General: She is awake. She is not in acute distress.    Appearance: Normal appearance. She is well-developed and well-groomed. She is not ill-appearing.     Comments: She is sitting comfortably in the exam chair in no acute distress.   HENT:     Head: Normocephalic and atraumatic.     Right Ear: Hearing, tympanic membrane, ear canal and external ear normal.     Left Ear: Hearing, tympanic membrane, ear canal and external ear normal.     Nose: Rhinorrhea present. Rhinorrhea is clear.     Right Sinus: Frontal sinus tenderness (slight) present. No maxillary sinus tenderness.     Left Sinus: Frontal sinus tenderness (slight) present. No maxillary sinus tenderness.     Mouth/Throat:     Lips: Pink.     Mouth: Mucous membranes are moist.     Pharynx: Oropharynx is clear. Uvula midline. No pharyngeal swelling, oropharyngeal exudate, posterior oropharyngeal erythema or uvula swelling.   Eyes:     Extraocular Movements: Extraocular movements intact.     Conjunctiva/sclera: Conjunctivae normal.  Cardiovascular:     Rate and Rhythm: Normal rate and regular rhythm.     Heart sounds: Normal heart sounds. No murmur heard.   Pulmonary:     Effort: Pulmonary effort is normal. No accessory muscle usage, respiratory distress or retractions.     Breath sounds: Normal air entry. No stridor. Examination of the right-upper field reveals decreased breath sounds and wheezing. Examination of the left-upper field reveals decreased breath sounds and wheezing.  Decreased breath sounds and wheezing present. No rhonchi or rales.  Musculoskeletal:     Cervical back: Normal range of motion and neck supple. No rigidity.  Lymphadenopathy:     Cervical: No cervical adenopathy.  Skin:    General: Skin is warm and dry.     Capillary Refill: Capillary refill takes less than 2 seconds.     Findings: No rash.  Neurological:     General: No focal deficit present.     Mental Status: She is alert and oriented to person, place, and time.  Psychiatric:        Mood and Affect: Mood normal.        Behavior: Behavior normal. Behavior is cooperative.        Thought Content: Thought content normal.        Judgment: Judgment normal.      UC Treatments / Results  Labs (all labs ordered are listed, but only abnormal results are displayed) Labs Reviewed  NOVEL CORONAVIRUS, NAA    EKG   Radiology DG Chest 2 View  Result Date: 08/19/2020 CLINICAL DATA:  Dry cough worse when lying down for 1 week, chest discomfort and congestion, pressure and pain with deep inspiration EXAM: CHEST - 2 VIEW COMPARISON:  11/06/2018 FINDINGS: Normal heart size, mediastinal contours, and pulmonary vascularity. Lungs clear. No pulmonary infiltrate, pleural effusion or pneumothorax. Osseous structures unremarkable. IMPRESSION: No acute abnormalities. Electronically Signed   By: Lavonia Dana M.D.   On: 08/19/2020 13:23     Procedures Procedures (including critical care time)  Medications Ordered in UC Medications - No data to display  Initial Impression / Assessment and Plan / UC Course  I have reviewed the triage vital signs and the nursing notes.  Pertinent labs & imaging results that were available during my care of the patient were reviewed by me and considered in my medical decision making (see chart for details).    Reviewed negative chest x-ray results with patient. Discussed that she probably has a mild sinus infection with drainage causing the cough. Since symptoms have been present for over 1 week and getting worse, especially with headache today, will treat for possible bacterial infection. Start Augmentin 875mg  twice a day as directed. May continue Mucinex as directed or may take OTC Delsym twice a day as directed. May take Tylenol 1000mg  or Ibuprofen 600mg  every 8 hours as needed for headache. Continue to push fluids to help loosen up mucus in chest. Note written for work. Follow-up pending COVID 19 test results and in 4 to 5 days if minimal improvement.  Final Clinical Impressions(s) / UC Diagnoses   Final diagnoses:  Cough  Sinus drainage  Chest discomfort     Discharge Instructions     Recommend start Augmentin 875mg  twice a day as directed. May take Tylenol 1000mg  or Ibuprofen 600mg  every 8 hours as needed for headache. Continue to push fluids to help loosen up mucus in chest. May take OTC Mucinex or Delsym as directed for cough. Follow-up pending COVID 19 test results and in 4 to 5 days if not improving.     ED Prescriptions    Medication Sig Dispense Auth. Provider   amoxicillin-clavulanate (AUGMENTIN) 875-125 MG tablet Take 1 tablet by mouth every 12 (twelve) hours for 7 days. 14 tablet Randalyn Ahmed, Nicholes Stairs, NP     PDMP not reviewed this encounter.   Katy Apo, NP 08/19/20 2030

## 2020-08-21 LAB — NOVEL CORONAVIRUS, NAA: SARS-CoV-2, NAA: NOT DETECTED

## 2020-08-21 LAB — SARS-COV-2, NAA 2 DAY TAT

## 2020-09-02 ENCOUNTER — Encounter: Payer: Self-pay | Admitting: Family Medicine

## 2020-09-02 ENCOUNTER — Other Ambulatory Visit: Payer: Self-pay

## 2020-09-02 ENCOUNTER — Telehealth: Payer: Self-pay

## 2020-09-02 ENCOUNTER — Ambulatory Visit: Payer: BC Managed Care – PPO | Admitting: Family Medicine

## 2020-09-02 VITALS — BP 114/68 | HR 63 | Temp 97.3°F | Ht 63.0 in | Wt 197.4 lb

## 2020-09-02 DIAGNOSIS — E661 Drug-induced obesity: Secondary | ICD-10-CM | POA: Diagnosis not present

## 2020-09-02 DIAGNOSIS — I10 Essential (primary) hypertension: Secondary | ICD-10-CM | POA: Diagnosis not present

## 2020-09-02 DIAGNOSIS — Z2821 Immunization not carried out because of patient refusal: Secondary | ICD-10-CM | POA: Diagnosis not present

## 2020-09-02 DIAGNOSIS — E782 Mixed hyperlipidemia: Secondary | ICD-10-CM | POA: Diagnosis not present

## 2020-09-02 DIAGNOSIS — Z6834 Body mass index (BMI) 34.0-34.9, adult: Secondary | ICD-10-CM

## 2020-09-02 DIAGNOSIS — E042 Nontoxic multinodular goiter: Secondary | ICD-10-CM

## 2020-09-02 MED ORDER — SEMAGLUTIDE-WEIGHT MANAGEMENT 2.4 MG/0.75ML ~~LOC~~ SOAJ: 2.4000 mg | SUBCUTANEOUS | 4 refills | Status: DC

## 2020-09-02 MED ORDER — SEMAGLUTIDE-WEIGHT MANAGEMENT 0.5 MG/0.5ML ~~LOC~~ SOAJ: 0.5000 mg | SUBCUTANEOUS | 0 refills | Status: AC

## 2020-09-02 MED ORDER — SEMAGLUTIDE-WEIGHT MANAGEMENT 1.7 MG/0.75ML ~~LOC~~ SOAJ: 1.7000 mg | SUBCUTANEOUS | 0 refills | Status: AC

## 2020-09-02 MED ORDER — SEMAGLUTIDE-WEIGHT MANAGEMENT 0.25 MG/0.5ML ~~LOC~~ SOAJ: 0.2500 mg | SUBCUTANEOUS | 0 refills | Status: DC

## 2020-09-02 MED ORDER — SEMAGLUTIDE-WEIGHT MANAGEMENT 1 MG/0.5ML ~~LOC~~ SOAJ: 1.0000 mg | SUBCUTANEOUS | 0 refills | Status: DC

## 2020-09-02 NOTE — Telephone Encounter (Signed)
PA for [facility] submitted through cover my meds,  KEY: [id number] waiting on response.  Dm/cma

## 2020-09-02 NOTE — Telephone Encounter (Signed)
PA approved from 09/02/20 - 04/02/21 by [facility]. Pharmacy notified [place] phone.  Dm/cma

## 2020-09-02 NOTE — Progress Notes (Signed)
Crystal Shea is a 49 y.o. female  Chief Complaint  Patient presents with  . Establish Care    NP- discuss weight loss.  declines flu shot today.     HPI: Crystal Shea is a 49 y.o. female here as a new patient to establish care with our office. Her previous PCP was Dr. Lin Landsman.  Pt declines flu vaccine today.  Pt would like to discuss weight loss options. Her BMI is 34.97. she has HTN and hyperlipidemia and is on medication for both. She follows with cardio.  She has been on phentermine in the past - worked initially but not once she stopped med.  Diet: eats healthy "most of the time". She has small portion sizes.  Exercise: "not the best" - was walking regularly prior to covid but then stopped and has not resumed She would like to try [facility].   She is overdue for US thyroid for 2 nodules. Last done in 2017.   Past Medical History:  Diagnosis Date  . Allergic   . CHF (congestive heart failure) ([place])   . Essential hypertension 12/14/2018  . Hyperlipidemia     Past Surgical History:  Procedure Laterality Date  . BLADDER REMOVAL    . CESAREAN SECTION     x2  . HAND SURGERY Left   . TUBAL LIGATION      Social History   Socioeconomic History  . Marital status: Single    Spouse name: Not on file  . Number of children: Not on file  . Years of education: Not on file  . Highest education level: Not on file  Occupational History  . Not on file  Tobacco Use  . Smoking status: Never Smoker  . Smokeless tobacco: Never Used  Vaping Use  . Vaping Use: Never used  Substance and Sexual Activity  . Alcohol use: No  . Drug use: No  . Sexual activity: Yes  Other Topics Concern  . Not on file  Social History Narrative  . Not on file   Social Determinants of Health   Financial Resource Strain:   . Difficulty of Paying Living Expenses: Not on file  Food Insecurity:   . Worried About Charity fundraiser in the Last Year: Not on file  . Ran Out of Food in the Last  Year: Not on file  Transportation Needs:   . Lack of Transportation (Medical): Not on file  . Lack of Transportation (Non-Medical): Not on file  Physical Activity:   . Days of Exercise per Week: Not on file  . Minutes of Exercise per Session: Not on file  Stress:   . Feeling of Stress : Not on file  Social Connections:   . Frequency of Communication with Friends and Family: Not on file  . Frequency of Social Gatherings with Friends and Family: Not on file  . Attends Religious Services: Not on file  . Active Member of Clubs or Organizations: Not on file  . Attends Archivist Meetings: Not on file  . Marital Status: Not on file  Intimate Partner Violence:   . Fear of Current or Ex-Partner: Not on file  . Emotionally Abused: Not on file  . Physically Abused: Not on file  . Sexually Abused: Not on file    Family History  Problem Relation Age of Onset  . Heart disease Mother   . Hypertension Mother   . Diabetes Mother   . Heart failure Sister   . Heart  failure Sister   . Heart disease Sister      Immunization History  Administered Date(s) Administered  . Moderna SARS-COVID-2 Vaccination 12/25/2019, 01/27/2020    Outpatient Encounter Medications as of 09/02/2020  Medication Sig  . atorvastatin (LIPITOR) 20 MG tablet Take 20 mg by mouth daily.  . cetirizine (ZYRTEC) 10 MG tablet Take 1 tablet by mouth daily as needed for allergies.   . fluticasone (FLONASE) 50 MCG/ACT nasal spray Place into both nostrils daily.  Marland Kitchen ibuprofen (ADVIL) 600 MG tablet Take 600 mg by mouth every 6 (six) hours as needed.  Marland Kitchen lisinopril-hydrochlorothiazide (ZESTORETIC) 20-12.5 MG tablet TAKE 1 TABLET BY MOUTH EVERY DAY  . Olopatadine HCl 0.2 % SOLN [place] 0.2 % eye drops  PLACE ONE DROP INTO BOTH EYES DAILY.  . valACYclovir (VALTREX) 500 MG tablet Take 500 mg by mouth daily.   No facility-administered encounter medications on file as of 09/02/2020.     ROS: Pertinent positives and  negatives noted in HPI. Remainder of ROS non-contributory   No Known Allergies  BP 114/68   Pulse 63   Temp (!) 97.3 F (36.3 C) (Temporal)   Ht 5\' 3"  (1.6 m)   Wt 197 lb 6.6 oz (89.5 kg)   LMP 02/03/2015 (Approximate)   SpO2 98%   BMI 34.97 kg/m   Physical Exam Constitutional:      Appearance: Normal appearance. She is obese.  Pulmonary:     Effort: No respiratory distress.  Neurological:     Mental Status: She is alert and oriented to person, place, and time.  Psychiatric:        Mood and Affect: Mood normal.        Behavior: Behavior normal.      A/P:   1. Influenza vaccination declined by patient  2. Class 1 drug-induced obesity with serious comorbidity and body mass index (BMI) of 34.0 to 34.9 in adult - has tried phentermine in the past, unsuccessful - discussed importance of regular CV exercise, healthy diet, adequate sleep - encouraged pt to resume regular walking - she was doing 3-4 miles most days per week pre-covid - limit carbs, sugars Rx: - Semaglutide-Weight Management 0.25 MG/0.5ML SOAJ; Inject 0.25 mg into the skin once a week for 28 days.  Dispense: 2 mL; Refill: 0 - Semaglutide-Weight Management 0.5 MG/0.5ML SOAJ; Inject 0.5 mg into the skin once a week for 28 days.  Dispense: 2 mL; Refill: 0 - Semaglutide-Weight Management 1 MG/0.5ML SOAJ; Inject 1 mg into the skin once a week for 28 days.  Dispense: 2 mL; Refill: 0 - Semaglutide-Weight Management 1.7 MG/0.75ML SOAJ; Inject 1.7 mg into the skin once a week for 28 days.  Dispense: 3 mL; Refill: 0 - Semaglutide-Weight Management 2.4 MG/0.75ML SOAJ; Inject 2.4 mg into the skin once a week.  Dispense: 3 mL; Refill: 4 - f/u in 3 mo or sooner PRN  3. Essential hypertension - cont, at goal - cont lisinopril-HCTZ 20-12.5mg   4. Mixed hyperlipidemia - follows with cardio - cont on lipitor 20mg  daily  5. Multiple thyroid nodules - US THYROID; Future  Pt overdue for CPE and labs - will schedule  This  visit occurred during the SARS-CoV-2 public health emergency.  Safety protocols were in place, including screening questions prior to the visit, additional usage of staff PPE, and extensive cleaning of exam room while observing appropriate contact time as indicated for disinfecting solutions.

## 2020-09-17 ENCOUNTER — Other Ambulatory Visit: Payer: BC Managed Care – PPO

## 2020-09-26 ENCOUNTER — Other Ambulatory Visit: Payer: Self-pay | Admitting: Family Medicine

## 2020-09-26 DIAGNOSIS — I10 Essential (primary) hypertension: Secondary | ICD-10-CM

## 2020-09-26 DIAGNOSIS — E661 Drug-induced obesity: Secondary | ICD-10-CM

## 2020-09-26 DIAGNOSIS — E782 Mixed hyperlipidemia: Secondary | ICD-10-CM

## 2020-09-29 ENCOUNTER — Other Ambulatory Visit: Payer: BC Managed Care – PPO

## 2020-10-05 ENCOUNTER — Ambulatory Visit
Admission: RE | Admit: 2020-10-05 | Discharge: 2020-10-05 | Disposition: A | Discharge status: Home / Self Care | Payer: BC Managed Care – PPO | Source: Ambulatory Visit | Attending: Family Medicine | Admitting: Family Medicine

## 2020-10-05 DIAGNOSIS — E042 Nontoxic multinodular goiter: Secondary | ICD-10-CM

## 2020-10-06 ENCOUNTER — Other Ambulatory Visit: Payer: Self-pay | Admitting: Family Medicine

## 2020-10-06 ENCOUNTER — Encounter: Payer: Self-pay | Admitting: Family Medicine

## 2020-10-06 DIAGNOSIS — E042 Nontoxic multinodular goiter: Secondary | ICD-10-CM

## 2020-10-18 ENCOUNTER — Ambulatory Visit (INDEPENDENT_AMBULATORY_CARE_PROVIDER_SITE_OTHER): Payer: BC Managed Care – PPO | Admitting: Otolaryngology

## 2020-10-18 ENCOUNTER — Encounter (INDEPENDENT_AMBULATORY_CARE_PROVIDER_SITE_OTHER): Payer: Self-pay | Admitting: Otolaryngology

## 2020-10-18 ENCOUNTER — Other Ambulatory Visit: Payer: Self-pay

## 2020-10-18 VITALS — Temp 97.3°F

## 2020-10-18 DIAGNOSIS — E042 Nontoxic multinodular goiter: Secondary | ICD-10-CM | POA: Diagnosis not present

## 2020-10-18 NOTE — Progress Notes (Signed)
HPI: Crystal Shea is a 50 y.o. female who presents is referred by her PCP for evaluation of left thyroid nodule.  Patient recently had an ultrasound of her thyroid performed that showed 3 nodules with a 1.7 cm left inferior thyroid nodule that was suspicious and met T RADS requirement for fine-needle aspirate.  Patient denies any history of radiation therapy to her neck.  She has had no hoarseness and no difficulty swallowing. She has history of hypertension..  Past Medical History:  Diagnosis Date  . Allergic   . CHF (congestive heart failure) ([place])   . Essential hypertension 12/14/2018  . Hyperlipidemia    Past Surgical History:  Procedure Laterality Date  . BLADDER REMOVAL    . CESAREAN SECTION     x2  . HAND SURGERY Left   . TUBAL LIGATION     Social History   Socioeconomic History  . Marital status: Single    Spouse name: Not on file  . Number of children: Not on file  . Years of education: Not on file  . Highest education level: Not on file  Occupational History  . Not on file  Tobacco Use  . Smoking status: Never Smoker  . Smokeless tobacco: Never Used  Vaping Use  . Vaping Use: Never used  Substance and Sexual Activity  . Alcohol use: No  . Drug use: No  . Sexual activity: Yes  Other Topics Concern  . Not on file  Social History Narrative  . Not on file   Social Determinants of Health   Financial Resource Strain: Not on file  Food Insecurity: Not on file  Transportation Needs: Not on file  Physical Activity: Not on file  Stress: Not on file  Social Connections: Not on file   Family History  Problem Relation Age of Onset  . Heart disease Mother   . Hypertension Mother   . Diabetes Mother   . Heart failure Sister   . Heart failure Sister   . Heart disease Sister    No Known Allergies Prior to Admission medications   Medication Sig Start Date End Date Taking? Authorizing Provider  atorvastatin (LIPITOR) 20 MG tablet Take 20 mg by mouth daily.     [provider]  cetirizine (ZYRTEC) 10 MG tablet Take 1 tablet by mouth daily as needed for allergies.  01/24/17   [provider]  fluticasone (FLONASE) 50 MCG/ACT nasal spray Place into both nostrils daily.    [provider]  ibuprofen (ADVIL) 600 MG tablet Take 600 mg by mouth every 6 (six) hours as needed. 04/26/20   [provider]  lisinopril-hydrochlorothiazide (ZESTORETIC) 20-12.5 MG tablet TAKE 1 TABLET BY MOUTH EVERY DAY 05/13/20   Adrian Prows, MD  Olopatadine HCl 0.2 % SOLN Pataday 0.2 % eye drops  PLACE ONE DROP INTO BOTH EYES DAILY.    [provider]  Semaglutide-Weight Management 0.5 MG/0.5ML SOAJ Inject 0.5 mg into the skin once a week for 28 days. 10/01/20 10/29/20  CiriglianoGarvin Fila, DO  Semaglutide-Weight Management 1 MG/0.5ML SOAJ Inject 1 mg into the skin once a week for 28 days. 10/30/20 11/27/20  CiriglianoGarvin Fila, DO  Semaglutide-Weight Management 1.7 MG/0.75ML SOAJ Inject 1.7 mg into the skin once a week for 28 days. 11/28/20 12/26/20  CiriglianoGarvin Fila, DO  Semaglutide-Weight Management 2.4 MG/0.75ML SOAJ Inject 2.4 mg into the skin once a week. 12/27/20   Cirigliano, Garvin Fila, DO  valACYclovir (VALTREX) 500 MG tablet Take 500 mg  by mouth daily. 08/23/20   [provider]  WEGOVY 0.25 MG/0.5ML SOAJ INJECT 0.25 MG INTO THE SKIN ONCE A WEEK FOR 28 DAYS. 09/27/20   Cirigliano, Garvin Fila, DO     Positive ROS: Otherwise negative  All other systems have been reviewed and were otherwise negative with the exception of those mentioned in the HPI and as above.  Physical Exam: Constitutional: Alert, well-appearing, no acute distress Ears: External ears without lesions or tenderness. Ear canals are clear bilaterally with intact, clear TMs.  Nasal: External nose without lesions. Clear nasal passages Oral: Lips and gums without lesions. Tongue and palate mucosa without lesions. Posterior oropharynx clear.  Indirect laryngoscopy revealed  normal mobility of the vocal cords bilaterally. Neck: Difficult to palpate the left inferior lobe thyroid nodule.  There is no discrete palpable adenopathy along the jugular chain of lymph nodes or the supraclavicular lymph nodes on either side. Respiratory: Breathing comfortably  Skin: No facial/neck lesions or rash noted.  Procedures  Assessment: Left inferior thyroid lobe nodule that meets criteria for fine-needle aspirate.  Plan: We will schedule patient for ultrasound-guided fine-needle aspirate of left thyroid nodule. Patient will call us back concerning results of the needle aspirate 5 to 7 days following the procedure.   Radene Journey, MD   CC:

## 2020-10-19 ENCOUNTER — Telehealth: Payer: BC Managed Care – PPO | Admitting: Physician Assistant

## 2020-10-19 DIAGNOSIS — J069 Acute upper respiratory infection, unspecified: Secondary | ICD-10-CM | POA: Diagnosis not present

## 2020-10-19 DIAGNOSIS — Z20822 Contact with and (suspected) exposure to covid-19: Secondary | ICD-10-CM

## 2020-10-19 NOTE — Progress Notes (Signed)
New Patient Office Visit  Subjective:  Patient ID: Crystal Shea, female    DOB: [birth year]  Age: 50 y.o. MRN: [id number]  CC: No chief complaint on file.  Virtual Visit via Telephone Note  I connected with Crystal Shea on 10/19/20 at  4:30 PM EST by telephone and verified that I am speaking with the correct person using two identifiers.  Location: Patient: Home Provider: [facility]    I discussed the limitations, risks, security and privacy concerns of performing an evaluation and management service by telephone and the availability of in person appointments. I also discussed with the patient that there may be a patient responsible charge related to this service. The patient expressed understanding and agreed to proceed.   History of Present Illness: Reports that she started having a dry cough and sore throat yesterday.  Reports that she did have some chills last night, did not measure temperature at that time.  Reports on and off headache today.  Reports that she has been using Mucinex and ibuprofen with relief.  Eating and drinking okay.  Does endorse sick contact, coworker tested positive for Covid yesterday.  Endorses 3 Moderna vaccines with her booster 10/06/2020.   Observations/Objective: Medical history and current medications reviewed, no physical exam completed    Past Medical History:  Diagnosis Date  . Allergic   . CHF (congestive heart failure) ([place])   . Essential hypertension 12/14/2018  . Hyperlipidemia     Past Surgical History:  Procedure Laterality Date  . BLADDER REMOVAL    . CESAREAN SECTION     x2  . HAND SURGERY Left   . TUBAL LIGATION      Family History  Problem Relation Age of Onset  . Heart disease Mother   . Hypertension Mother   . Diabetes Mother   . Heart failure Sister   . Heart failure Sister   . Heart disease Sister     Social History   Socioeconomic History  . Marital status: Single    Spouse name:  Not on file  . Number of children: Not on file  . Years of education: Not on file  . Highest education level: Not on file  Occupational History  . Not on file  Tobacco Use  . Smoking status: Never Smoker  . Smokeless tobacco: Never Used  Vaping Use  . Vaping Use: Never used  Substance and Sexual Activity  . Alcohol use: No  . Drug use: No  . Sexual activity: Yes  Other Topics Concern  . Not on file  Social History Narrative  . Not on file   Social Determinants of Health   Financial Resource Strain: Not on file  Food Insecurity: Not on file  Transportation Needs: Not on file  Physical Activity: Not on file  Stress: Not on file  Social Connections: Not on file  Intimate Partner Violence: Not on file    ROS Review of Systems  Constitutional: Positive for chills. Negative for fatigue and fever.  HENT: Positive for sore throat. Negative for congestion, sinus pressure and trouble swallowing.   Eyes: Negative.   Respiratory: Positive for cough. Negative for shortness of breath and wheezing.   Cardiovascular: Negative for chest pain and palpitations.  Gastrointestinal: Negative for abdominal pain, diarrhea, nausea and vomiting.  Endocrine: Negative.   Genitourinary: Negative.   Musculoskeletal: Negative for myalgias.  Skin: Negative.   Allergic/Immunologic: Negative.   Neurological: Positive for headaches.  Hematological: Negative.  Psychiatric/Behavioral: Negative.     Objective:   Today's Vitals: LMP 02/03/2015 (Approximate)     Assessment & Plan:   Problem List Items Addressed This Visit   None   Visit Diagnoses    Upper respiratory tract infection, unspecified type    -  Primary   Relevant Orders   POC COVID-19      Outpatient Encounter Medications as of 10/19/2020  Medication Sig  . atorvastatin (LIPITOR) 20 MG tablet Take 20 mg by mouth daily.  . cetirizine (ZYRTEC) 10 MG tablet Take 1 tablet by mouth daily as needed for allergies.   . fluticasone  (FLONASE) 50 MCG/ACT nasal spray Place into both nostrils daily.  Marland Kitchen ibuprofen (ADVIL) 600 MG tablet Take 600 mg by mouth every 6 (six) hours as needed.  Marland Kitchen lisinopril-hydrochlorothiazide (ZESTORETIC) 20-12.5 MG tablet TAKE 1 TABLET BY MOUTH EVERY DAY  . Olopatadine HCl 0.2 % SOLN Pataday 0.2 % eye drops  PLACE ONE DROP INTO BOTH EYES DAILY.  Marland Kitchen Semaglutide-Weight Management 0.5 MG/0.5ML SOAJ Inject 0.5 mg into the skin once a week for 28 days.  Melene Muller ON 10/30/2020] Semaglutide-Weight Management 1 MG/0.5ML SOAJ Inject 1 mg into the skin once a week for 28 days.  Melene Muller ON 11/28/2020] Semaglutide-Weight Management 1.7 MG/0.75ML SOAJ Inject 1.7 mg into the skin once a week for 28 days.  Melene Muller ON 12/27/2020] Semaglutide-Weight Management 2.4 MG/0.75ML SOAJ Inject 2.4 mg into the skin once a week.  . valACYclovir (VALTREX) 500 MG tablet Take 500 mg by mouth daily.  . WEGOVY 0.25 MG/0.5ML SOAJ INJECT 0.25 MG INTO THE SKIN ONCE A WEEK FOR 28 DAYS.   No facility-administered encounter medications on file as of 10/19/2020.    Assessment and Plan: 1. Upper respiratory tract infection, unspecified type Rapid Covid testing negative, encourage patient to continue over-the-counter cold treatment, increase hydration and rest.  Patient education given.  Red flags given for prompt reevaluation - POC COVID-19   Follow Up Instructions:    I discussed the assessment and treatment plan with the patient. The patient was provided an opportunity to ask questions and all were answered. The patient agreed with the plan and demonstrated an understanding of the instructions.   The patient was advised to call back or seek an in-person evaluation if the symptoms worsen or if the condition fails to improve as anticipated.  I provided 21 minutes of non-face-to-face time during this encounter.     Follow-up: No follow-ups on file.   Kasandra Knudsen Mayers, PA-C

## 2020-10-19 NOTE — Progress Notes (Signed)
Patient complains of symptoms beginning yesterday. Patient complains of dry cough with sore throat. Patient denies N/V/Diarrhea. Patient denies any fevers. Patient does have an appetite and smell and taste are present. Patient took mucinex this morning with minimal relief. Patient complains HA intermittently today as a band. Patient denies household with any symptoms. Coworker tested positive today who she spoke with on yesterday.

## 2020-10-20 LAB — POC COVID19 BINAXNOW: SARS Coronavirus 2 Ag: NEGATIVE

## 2020-10-21 ENCOUNTER — Other Ambulatory Visit (INDEPENDENT_AMBULATORY_CARE_PROVIDER_SITE_OTHER): Payer: Self-pay

## 2020-10-21 DIAGNOSIS — D440 Neoplasm of uncertain behavior of thyroid gland: Secondary | ICD-10-CM

## 2020-10-21 DIAGNOSIS — J069 Acute upper respiratory infection, unspecified: Secondary | ICD-10-CM | POA: Insufficient documentation

## 2020-10-21 NOTE — Patient Instructions (Signed)
Your rapid Covid test was negative, continue using over-the-counter cold medication as we discussed, being mindful of the medications you use due to your diagnosis of hypertension.  Continue getting lots of rest and plenty of hydration.  Please feel free to return to the mobile unit if your symptoms do not improve.  I hope that you feel better soon  Kennieth Rad, PA-C Physician Assistant [place] [email or web address]    Upper Respiratory Infection, Adult An upper respiratory infection (URI) is a common viral infection of the nose, throat, and upper air passages that lead to the lungs. The most common type of URI is the common cold. URIs usually get better on their own, without medical treatment. What are the causes? A URI is caused by a virus. You may catch a virus by:  Breathing in droplets from an infected person's cough or sneeze.  Touching something that has been exposed to the virus (contaminated) and then touching your mouth, nose, or eyes. What increases the risk? You are more likely to get a URI if:  You are very young or very old.  It is autumn or winter.  You have close contact with others, such as at a daycare, school, or health care facility.  You smoke.  You have long-term (chronic) heart or lung disease.  You have a weakened disease-fighting (immune) system.  You have nasal allergies or asthma.  You are experiencing a lot of stress.  You work in an area that has poor air circulation.  You have poor nutrition. What are the signs or symptoms? A URI usually involves some of the following symptoms:  Runny or stuffy (congested) nose.  Sneezing.  Cough.  Sore throat.  Headache.  Fatigue.  Fever.  Loss of appetite.  Pain in your forehead, behind your eyes, and over your cheekbones (sinus pain).  Muscle aches.  Redness or irritation of the eyes.  Pressure in the ears or face. How is  this diagnosed? This condition may be diagnosed based on your medical history and symptoms, and a physical exam. Your health care provider may use a cotton swab to take a mucus sample from your nose (nasal swab). This sample can be tested to determine what virus is causing the illness. How is this treated? URIs usually get better on their own within 7-10 days. You can take steps at home to relieve your symptoms. Medicines cannot cure URIs, but your health care provider may recommend certain medicines to help relieve symptoms, such as:  Over-the-counter cold medicines.  Cough suppressants. Coughing is a type of defense against infection that helps to clear the respiratory system, so take these medicines only as recommended by your health care provider.  Fever-reducing medicines. Follow these instructions at home: Activity  Rest as needed.  If you have a fever, stay home from work or school until your fever is gone or until your health care provider says you are no longer contagious. Your health care provider may have you wear a face mask to prevent your infection from spreading. Relieving symptoms  Gargle with a salt-water mixture 3-4 times a day or as needed. To make a salt-water mixture, completely dissolve -1 tsp of salt in 1 cup of warm water.  Use a cool-mist humidifier to add moisture to the air. This can help you breathe more easily. Eating and drinking   Drink enough fluid to keep your urine pale yellow.  Eat soups and other clear broths. General instructions   Take over-the-counter  and prescription medicines only as told by your health care provider. These include cold medicines, fever reducers, and cough suppressants.  Do not use any products that contain nicotine or tobacco, such as cigarettes and e-cigarettes. If you need help quitting, ask your health care provider.  Stay away from secondhand smoke.  Stay up to date on all immunizations, including the yearly (annual)  flu vaccine.  Keep all follow-up visits as told by your health care provider. This is important. How to prevent the spread of infection to others   URIs can be passed from person to person (are contagious). To prevent the infection from spreading: ? Wash your hands often with soap and water. If soap and water are not available, use hand sanitizer. ? Avoid touching your mouth, face, eyes, or nose. ? Cough or sneeze into a tissue or your sleeve or elbow instead of into your hand or into the air. Contact a health care provider if:  You are getting worse instead of better.  You have a fever or chills.  Your mucus is brown or red.  You have yellow or brown discharge coming from your nose.  You have pain in your face, especially when you bend forward.  You have swollen neck glands.  You have pain while swallowing.  You have white areas in the back of your throat. Get help right away if:  You have shortness of breath that gets worse.  You have severe or persistent: ? Headache. ? Ear pain. ? Sinus pain. ? Chest pain.  You have chronic lung disease along with any of the following: ? Wheezing. ? Prolonged cough. ? Coughing up blood. ? A change in your usual mucus.  You have a stiff neck.  You have changes in your: ? Vision. ? Hearing. ? Thinking. ? Mood. Summary  An upper respiratory infection (URI) is a common infection of the nose, throat, and upper air passages that lead to the lungs.  A URI is caused by a virus.  URIs usually get better on their own within 7-10 days.  Medicines cannot cure URIs, but your health care provider may recommend certain medicines to help relieve symptoms. This information is not intended to replace advice given to you by your health care provider. Make sure you discuss any questions you have with your health care provider. Document Revised: 10/10/2018 Document Reviewed: 05/18/2017 Elsevier Patient Education  2020 [employer].

## 2020-10-24 ENCOUNTER — Other Ambulatory Visit: Payer: Self-pay | Admitting: Family Medicine

## 2020-10-24 DIAGNOSIS — E661 Drug-induced obesity: Secondary | ICD-10-CM

## 2020-10-24 DIAGNOSIS — I10 Essential (primary) hypertension: Secondary | ICD-10-CM

## 2020-10-24 DIAGNOSIS — E782 Mixed hyperlipidemia: Secondary | ICD-10-CM

## 2020-10-25 ENCOUNTER — Ambulatory Visit (INDEPENDENT_AMBULATORY_CARE_PROVIDER_SITE_OTHER): Payer: BC Managed Care – PPO | Admitting: Otolaryngology

## 2020-10-27 NOTE — Telephone Encounter (Signed)
Pt called to follow up on this, I told her we were in the process of working on it and that if there were any issues we would call her back.

## 2020-10-28 NOTE — Telephone Encounter (Signed)
Patient notified VIA phone.  No questions.  Dm/cma ? ?

## 2020-10-28 NOTE — Telephone Encounter (Signed)
I had sent individual Rx for each dose of this med when it was initially Rx'd. She should be increasing dose every 4 wks/month so should not need a refill of this current dose. Pharmacy should have Rxs on file for increased dosages

## 2020-10-28 NOTE — Telephone Encounter (Signed)
Refill request for:  Wegovy  LR 09/27/20, #1, o rf LOV11/18/21 FOV 12/10/20

## 2020-11-09 ENCOUNTER — Ambulatory Visit
Admission: RE | Admit: 2020-11-09 | Discharge: 2020-11-09 | Disposition: A | Discharge status: Home / Self Care | Payer: BC Managed Care – PPO | Source: Ambulatory Visit | Attending: Otolaryngology | Admitting: Otolaryngology

## 2020-11-09 ENCOUNTER — Other Ambulatory Visit (HOSPITAL_COMMUNITY)
Admission: RE | Admit: 2020-11-09 | Discharge: 2020-11-09 | Disposition: A | Discharge status: Home / Self Care | Payer: BC Managed Care – PPO | Source: Ambulatory Visit | Attending: Radiology | Admitting: Radiology

## 2020-11-09 DIAGNOSIS — D440 Neoplasm of uncertain behavior of thyroid gland: Secondary | ICD-10-CM

## 2020-11-10 LAB — CYTOLOGY - NON PAP

## 2020-12-09 ENCOUNTER — Other Ambulatory Visit: Payer: Self-pay

## 2020-12-10 ENCOUNTER — Ambulatory Visit (INDEPENDENT_AMBULATORY_CARE_PROVIDER_SITE_OTHER): Payer: BC Managed Care – PPO | Admitting: Family Medicine

## 2020-12-10 ENCOUNTER — Encounter: Payer: Self-pay | Admitting: Family Medicine

## 2020-12-10 VITALS — BP 118/74 | HR 67 | Temp 97.9°F | Ht 63.0 in | Wt 182.8 lb

## 2020-12-10 DIAGNOSIS — Z1321 Encounter for screening for nutritional disorder: Secondary | ICD-10-CM

## 2020-12-10 DIAGNOSIS — Z0000 Encounter for general adult medical examination without abnormal findings: Secondary | ICD-10-CM

## 2020-12-10 DIAGNOSIS — I10 Essential (primary) hypertension: Secondary | ICD-10-CM

## 2020-12-10 DIAGNOSIS — E782 Mixed hyperlipidemia: Secondary | ICD-10-CM

## 2020-12-10 DIAGNOSIS — Z1159 Encounter for screening for other viral diseases: Secondary | ICD-10-CM

## 2020-12-10 DIAGNOSIS — E042 Nontoxic multinodular goiter: Secondary | ICD-10-CM

## 2020-12-10 LAB — CBC
HCT: 38.5 % (ref 36.0–46.0)
Hemoglobin: 13 g/dL (ref 12.0–15.0)
MCHC: 33.8 g/dL (ref 30.0–36.0)
MCV: 87.1 fl (ref 78.0–100.0)
Platelets: 228 10*3/uL (ref 150.0–400.0)
RBC: 4.42 Mil/uL (ref 3.87–5.11)
RDW: 13.8 % (ref 11.5–15.5)
WBC: 4.2 10*3/uL (ref 4.0–10.5)

## 2020-12-10 LAB — LIPID PANEL
Cholesterol: 175 mg/dL (ref 0–200)
HDL: 53.4 mg/dL (ref >39.00)
LDL Cholesterol: 110 mg/dL — ABNORMAL HIGH (ref 0–99)
NonHDL: 122.08
Total CHOL/HDL Ratio: 3
Triglycerides: 62 mg/dL (ref 0.0–149.0)
VLDL: 12.4 mg/dL (ref 0.0–40.0)

## 2020-12-10 LAB — AST: AST: 15 U/L (ref 0–37)

## 2020-12-10 LAB — VITAMIN D 25 HYDROXY (VIT D DEFICIENCY, FRACTURES): VITD: 21.81 ng/mL — ABNORMAL LOW (ref 30.00–100.00)

## 2020-12-10 LAB — BASIC METABOLIC PANEL
BUN: 13 mg/dL (ref 6–23)
CO2: 32 mEq/L (ref 19–32)
Calcium: 9.3 mg/dL (ref 8.4–10.5)
Chloride: 105 mEq/L (ref 96–112)
Creatinine, Ser: 0.95 mg/dL (ref 0.40–1.20)
GFR: 70.97 mL/min (ref >60.00)
Glucose, Bld: 83 mg/dL (ref 70–99)
Potassium: 3.2 mEq/L — ABNORMAL LOW (ref 3.5–5.1)
Sodium: 142 mEq/L (ref 135–145)

## 2020-12-10 LAB — ALT: ALT: 13 U/L (ref 0–35)

## 2020-12-10 LAB — TSH: TSH: 1.08 u[IU]/mL (ref 0.35–4.50)

## 2020-12-10 MED ORDER — ATORVASTATIN CALCIUM 20 MG PO TABS: 20.0000 mg | ORAL_TABLET | Freq: Every day | ORAL | 3 refills | Status: DC

## 2020-12-10 MED ORDER — LISINOPRIL-HYDROCHLOROTHIAZIDE 20-12.5 MG PO TABS: 1.0000 | ORAL_TABLET | Freq: Every day | ORAL | 3 refills | Status: DC

## 2020-12-10 NOTE — Progress Notes (Signed)
Crystal Shea is a 50 y.o. female  Chief Complaint  Patient presents with  . Annual Exam    CPE/labs.  C/o still having leg/feet swelling daily.  Last Pap & mammogram 09/2020 with [facility] Colonoscopy 2017-2018    HPI: Crystal Shea is a 50 y.o. female seen today for annual CPE, fasting labs.  Last PAP: 03/2020 with GYN - normal/negative Last mammo: 03/2020 with GYN - normal Last colonoscopy: 2016 - Dr. Anson Fret - pt is unsure of follow-up but will call Dr. Estell Harpin office to find out  Diet/Exercise: no walking, working to improve diet Dental: due  Vision: UTD, wears glasses  Med refills needed today? Yes per orders   Past Medical History:  Diagnosis Date  . Allergic   . CHF (congestive heart failure) ([place])   . Essential hypertension 12/14/2018  . Hyperlipidemia     Past Surgical History:  Procedure Laterality Date  . BLADDER REMOVAL    . CESAREAN SECTION     x2  . HAND SURGERY Left   . TUBAL LIGATION      Social History   Socioeconomic History  . Marital status: Single    Spouse name: Not on file  . Number of children: Not on file  . Years of education: Not on file  . Highest education level: Not on file  Occupational History  . Not on file  Tobacco Use  . Smoking status: Never Smoker  . Smokeless tobacco: Never Used  Vaping Use  . Vaping Use: Never used  Substance and Sexual Activity  . Alcohol use: No  . Drug use: No  . Sexual activity: Yes  Other Topics Concern  . Not on file  Social History Narrative  . Not on file   Social Determinants of Health   Financial Resource Strain: Not on file  Food Insecurity: Not on file  Transportation Needs: Not on file  Physical Activity: Not on file  Stress: Not on file  Social Connections: Not on file  Intimate Partner Violence: Not on file    Family History  Problem Relation Age of Onset  . Heart disease Mother   . Hypertension Mother   . Diabetes Mother   . Heart failure Sister   . Heart  failure Sister   . Heart disease Sister      Immunization History  Administered Date(s) Administered  . Influenza-Unspecified 10/06/2020  . Moderna Sars-Covid-2 Vaccination 12/25/2019, 01/27/2020, 10/06/2020    Outpatient Encounter Medications as of 12/10/2020  Medication Sig  . atorvastatin (LIPITOR) 20 MG tablet Take 20 mg by mouth daily.  . cetirizine (ZYRTEC) 10 MG tablet Take 1 tablet by mouth daily as needed for allergies.   . fluticasone (FLONASE) 50 MCG/ACT nasal spray Place into both nostrils daily.  Marland Kitchen lisinopril-hydrochlorothiazide (ZESTORETIC) 20-12.5 MG tablet TAKE 1 TABLET BY MOUTH EVERY DAY  . Olopatadine HCl 0.2 % SOLN Pataday 0.2 % eye drops  PLACE ONE DROP INTO BOTH EYES DAILY.  Marland Kitchen Semaglutide-Weight Management 1.7 MG/0.75ML SOAJ Inject 1.7 mg into the skin once a week for 28 days.  Derrill Memo ON 12/27/2020] Semaglutide-Weight Management 2.4 MG/0.75ML SOAJ Inject 2.4 mg into the skin once a week.  . valACYclovir (VALTREX) 500 MG tablet Take 500 mg by mouth daily.  . WEGOVY 0.25 MG/0.5ML SOAJ INJECT 0.25 MG INTO THE SKIN ONCE A WEEK FOR 28 DAYS.  Marland Kitchen ibuprofen (ADVIL) 600 MG tablet Take 600 mg by mouth every 6 (six) hours as needed. (Patient not  taking: Reported on 12/10/2020)   No facility-administered encounter medications on file as of 12/10/2020.     ROS: Gen: no fever, chills  Skin: no rash, itching ENT: no ear pain, ear drainage, nasal congestion, rhinorrhea, sinus pressure, sore throat; thyroid nodules - bx in 10/2020 - benign Eyes: no blurry vision, double vision Resp: no cough, wheeze,SOB Breast: no breast tenderness, no nipple discharge, no breast masses CV: no CP, palpitations, LE edema,  GI: no heartburn, n/v/d/c, abd pain GU: no dysuria, urgency, frequency, hematuria MSK: no joint pain, myalgias, back pain Neuro: no dizziness, headache, weakness, vertigo Psych: no depression, anxiety, insomnia   No Known Allergies  BP 118/74   Pulse 67   Temp 97.9 F  (36.6 C) (Temporal)   Ht 5\' 3"  (1.6 m)   Wt 182 lb 12.8 oz (82.9 kg)   LMP 02/03/2015 (Approximate)   SpO2 97%   BMI 32.38 kg/m   Wt Readings from Last 3 Encounters:  12/10/20 182 lb 12.8 oz (82.9 kg)  09/02/20 197 lb 6.6 oz (89.5 kg)  02/26/20 191 lb (86.6 kg)   Temp Readings from Last 3 Encounters:  12/10/20 97.9 F (36.6 C) (Temporal)  10/18/20 (!) 97.3 F (36.3 C) (Tympanic)  09/02/20 (!) 97.3 F (36.3 C) (Temporal)   BP Readings from Last 3 Encounters:  12/10/20 118/74  09/02/20 114/68  08/19/20 125/76   Pulse Readings from Last 3 Encounters:  12/10/20 67  09/02/20 63  08/19/20 70     Physical Exam Constitutional:      General: She is not in acute distress.    Appearance: She is well-developed and well-nourished.  HENT:     Head: Normocephalic and atraumatic.     Right Ear: Tympanic membrane and ear canal normal.     Left Ear: Tympanic membrane and ear canal normal.     Nose: Nose normal.     Mouth/Throat:     Mouth: Oropharynx is clear and moist and mucous membranes are normal.  Eyes:     Conjunctiva/sclera: Conjunctivae normal.  Neck:     Thyroid: No thyromegaly.  Cardiovascular:     Rate and Rhythm: Normal rate and regular rhythm.     Pulses: Intact distal pulses.     Heart sounds: Normal heart sounds. No murmur heard.   Pulmonary:     Effort: Pulmonary effort is normal. No respiratory distress.     Breath sounds: Normal breath sounds. No wheezing or rhonchi.  Abdominal:     General: Bowel sounds are normal. There is no distension.     Palpations: Abdomen is soft. There is no mass.     Tenderness: There is no abdominal tenderness.  Musculoskeletal:        General: No edema.     Cervical back: Neck supple.     Right lower leg: No edema.     Left lower leg: No edema.  Lymphadenopathy:     Cervical: No cervical adenopathy.  Skin:    General: Skin is warm and dry.  Neurological:     Mental Status: She is alert and oriented to person, place,  and time.     Motor: No abnormal muscle tone.     Coordination: Coordination normal.  Psychiatric:        Mood and Affect: Mood and affect normal.        Behavior: Behavior normal.     A/P:  1. Annual physical exam - discussed importance of regular CV exercise, healthy diet, adequate sleep -  UTD on PAP, mammo - last colonoscopy in 2016 (Dr. Penelope Coop) - immunizations UTD - vision UTD, due for dental exam - ALT - AST - Basic metabolic panel - CBC - next CPE in 1 year  2. Essential hypertension - controlled, at goal - Basic metabolic panel - CBC Refill: - lisinopril-hydrochlorothiazide (ZESTORETIC) 20-12.5 MG tablet; Take 1 tablet by mouth daily.  Dispense: 90 tablet; Refill: 3  3. Mixed hyperlipidemia - Lipid panel Refill: - atorvastatin (LIPITOR) 20 MG tablet; Take 1 tablet (20 mg total) by mouth daily.  Dispense: 90 tablet; Refill: 3  4. Multiple thyroid nodules - recent bx was benign - TSH - T4  5. Encounter for vitamin deficiency screening - VITAMIN D 25 Hydroxy (Vit-D Deficiency, Fractures)  6. Encounter for hepatitis C screening test for low risk patient - Hepatitis C Antibody   This visit occurred during the SARS-CoV-2 public health emergency.  Safety protocols were in place, including screening questions prior to the visit, additional usage of staff PPE, and extensive cleaning of exam room while observing appropriate contact time as indicated for disinfecting solutions.

## 2020-12-10 NOTE — Patient Instructions (Addendum)
Increase water intake with goal of 64oz per day Limit salt, sodium Keep feet elevated  Compression stockings

## 2020-12-13 LAB — T4: T4, Total: 7.9 ug/dL (ref 5.1–11.9)

## 2020-12-13 LAB — HEPATITIS C ANTIBODY
Hepatitis C Ab: NONREACTIVE
SIGNAL TO CUT-OFF: 0.01 (ref <1.00)

## 2020-12-17 ENCOUNTER — Other Ambulatory Visit: Payer: Self-pay | Admitting: Family Medicine

## 2020-12-17 ENCOUNTER — Encounter: Payer: Self-pay | Admitting: Family Medicine

## 2020-12-17 DIAGNOSIS — E559 Vitamin D deficiency, unspecified: Secondary | ICD-10-CM

## 2020-12-17 MED ORDER — VITAMIN D (ERGOCALCIFEROL) 1.25 MG (50000 UNIT) PO CAPS: 50000.0000 [IU] | ORAL_CAPSULE | ORAL | 2 refills | Status: DC

## 2020-12-26 ENCOUNTER — Other Ambulatory Visit: Payer: Self-pay | Admitting: Family Medicine

## 2020-12-26 DIAGNOSIS — I10 Essential (primary) hypertension: Secondary | ICD-10-CM

## 2020-12-26 DIAGNOSIS — Z6834 Body mass index (BMI) 34.0-34.9, adult: Secondary | ICD-10-CM

## 2020-12-26 DIAGNOSIS — E782 Mixed hyperlipidemia: Secondary | ICD-10-CM

## 2020-12-26 DIAGNOSIS — E661 Drug-induced obesity: Secondary | ICD-10-CM

## 2021-01-02 IMAGING — DX DG CHEST 2V
2 series · 2 of 2 positions shown · non-contrast
Comparison: None.

CLINICAL DATA: 47-year-old female with chest pain and cough

EXAM:
CHEST - 2 VIEW

[chest pa]
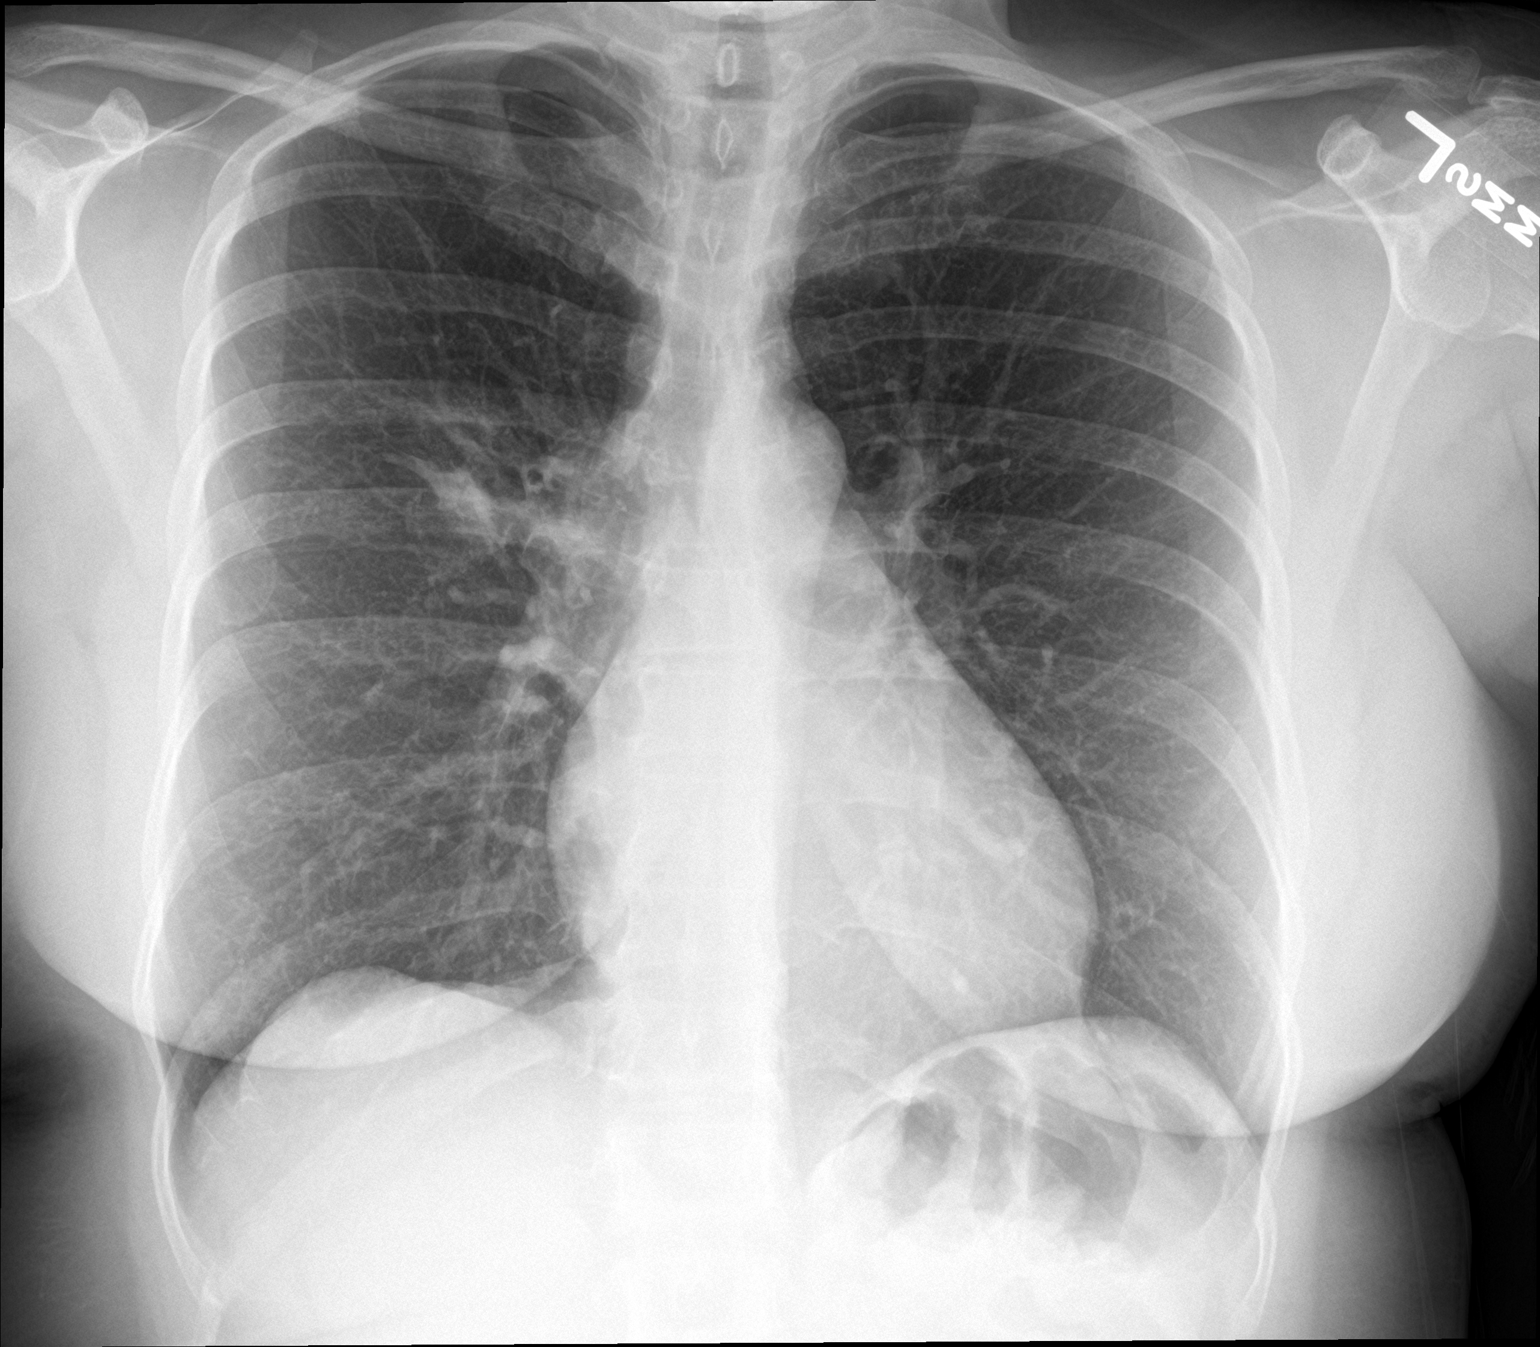

[chest lat]
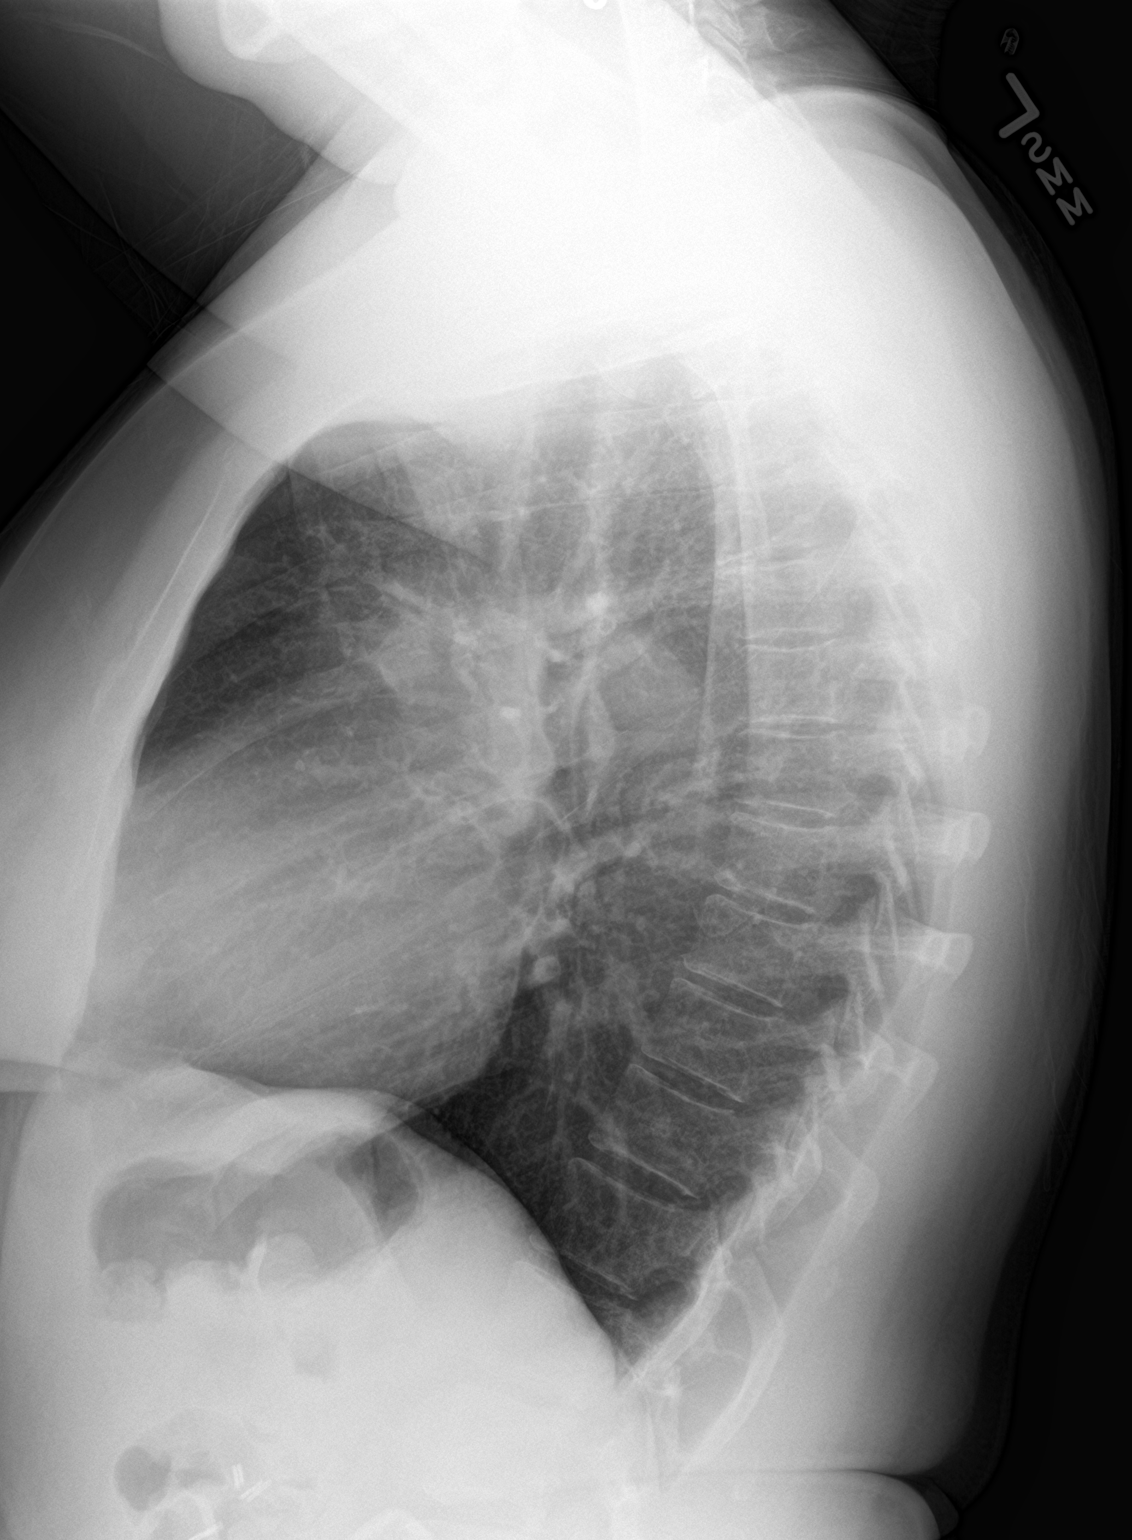

[2 of 2 positions shown; findings below may reference images not displayed]

FINDINGS: The heart size and mediastinal contours are within normal limits.
Both lungs are clear. The visualized skeletal structures are
unremarkable.
IMPRESSION: Negative for acute cardiopulmonary disease

## 2021-02-28 ENCOUNTER — Ambulatory Visit: Payer: BC Managed Care – PPO | Admitting: Cardiology

## 2021-03-09 ENCOUNTER — Other Ambulatory Visit: Payer: Self-pay

## 2021-03-09 ENCOUNTER — Ambulatory Visit: Payer: BC Managed Care – PPO | Admitting: Cardiology

## 2021-03-09 ENCOUNTER — Encounter: Payer: Self-pay | Admitting: Cardiology

## 2021-03-09 VITALS — BP 105/71 | HR 69 | Temp 98.2°F | Resp 16 | Ht 63.0 in | Wt 175.4 lb

## 2021-03-09 DIAGNOSIS — Z8249 Family history of ischemic heart disease and other diseases of the circulatory system: Secondary | ICD-10-CM

## 2021-03-09 DIAGNOSIS — E7800 Pure hypercholesterolemia, unspecified: Secondary | ICD-10-CM

## 2021-03-09 DIAGNOSIS — I10 Essential (primary) hypertension: Secondary | ICD-10-CM

## 2021-03-09 DIAGNOSIS — R9431 Abnormal electrocardiogram [ECG] [EKG]: Secondary | ICD-10-CM

## 2021-03-09 MED ORDER — EZETIMIBE 10 MG PO TABS: 10.0000 mg | ORAL_TABLET | Freq: Every day | ORAL | 3 refills | Status: DC

## 2021-03-09 NOTE — Progress Notes (Signed)
Primary Physician/Referring:  Ronnald Nian, DO  Patient ID: Crystal Shea, female    DOB: September 07, 1971, 50 y.o.   MRN: [id number]  Chief Complaint  Patient presents with  . Hypertension  . Hyperlipidemia  . Follow-up    1 year   HPI:    Crystal Shea  is a 50 y.o. with hyperlipidemia, hypertension, but a strong family history of premature coronary artery disease in which grandmother had MI in her 49s, 1 sister died at age 84 with CAD and CVA and 2 older sisters both in their 41s had coronary artery disease diagnosis.  This is her annual visit. She has not had any chest pain, she is tolerating all her medications well and states that her blood pressure has been well controlled. She remains asymptomatic.   Past Medical History:  Diagnosis Date  . Allergic   . CHF (congestive heart failure) ([place])   . Essential hypertension 12/14/2018  . Hyperlipidemia    Past Surgical History:  Procedure Laterality Date  . BLADDER REMOVAL    . CESAREAN SECTION     x2  . HAND SURGERY Left   . TUBAL LIGATION     Family History  Problem Relation Age of Onset  . Heart disease Mother   . Hypertension Mother   . Diabetes Mother   . Heart attack Mother 42  . Heart failure Sister   . Heart failure Sister   . Heart attack Sister 28  . Heart disease Sister   . Heart failure Sister   . Seizures Sister     Social History   Tobacco Use  . Smoking status: Never Smoker  . Smokeless tobacco: Never Used  Substance Use Topics  . Alcohol use: No   Marital Status: Single  ROS  Review of Systems  Constitutional: Negative for weight gain.  Cardiovascular: Negative for chest pain, dyspnea on exertion and leg swelling.  Respiratory: Negative for shortness of breath.   Musculoskeletal: Negative for joint swelling.   Objective  Blood pressure 105/71, pulse 69, temperature 98.2 F (36.8 C), temperature source Temporal, resp. rate 16, height 5\' 3"  (1.6 m), weight 175 lb 6.4 oz (79.6 kg), last  menstrual period 02/03/2015, SpO2 98 %.  Vitals with BMI 03/09/2021 12/10/2020 09/02/2020  Height 5\' 3"  5\' 3"  5\' 3"   Weight 175 lbs 6 oz 182 lbs 13 oz 197 lbs 7 oz  BMI 31.08 95.62 13.08  Systolic 657 846 962  Diastolic 71 74 68  Pulse 69 67 63     Physical Exam Constitutional:      General: She is not in acute distress.    Comments: Moderately built and mildly obese in no acute distress  Cardiovascular:     Rate and Rhythm: Normal rate and regular rhythm.     Pulses: Intact distal pulses.     Heart sounds: No murmur heard. No gallop.      Comments: No leg edema. No JVD.   Pulmonary:     Effort: Pulmonary effort is normal. No accessory muscle usage or respiratory distress.     Breath sounds: Normal breath sounds.  Abdominal:     Palpations: Abdomen is soft.  Skin:    Capillary Refill: Capillary refill takes less than 2 seconds.  Neurological:     General: No focal deficit present.     Mental Status: She is oriented to person, place, and time.    Laboratory examination:   Recent Labs    12/10/20 [phone]  NA 142  K 3.2*  CL 105  CO2 32  GLUCOSE 83  BUN 13  CREATININE 0.95  CALCIUM 9.3   CrCl cannot be calculated (Patient's most recent lab result is older than the maximum 21 days allowed.).  CMP Latest Ref Rng & Units 12/10/2020 03/05/2020 07/14/2019  Glucose 70 - 99 mg/dL 83 97 110(H)  BUN 6 - 23 mg/dL 13 15 13   Creatinine 0.40 - 1.20 mg/dL 0.95 0.95 0.84  Sodium 135 - 145 mEq/L 142 145(H) 141  Potassium 3.5 - 5.1 mEq/L 3.2(L) 4.6 4.0  Chloride 96 - 112 mEq/L 105 104 104  CO2 19 - 32 mEq/L 32 25 26  Calcium 8.4 - 10.5 mg/dL 9.3 9.5 9.3  Total Protein 6.0 - 8.5 g/dL - 7.5 8.0  Total Bilirubin 0.0 - 1.2 mg/dL - 0.5 0.7  Alkaline Phos 48 - 121 IU/L - 110 90  AST 0 - 37 U/L 15 20 19   ALT 0 - 35 U/L 13 13 19    CBC Latest Ref Rng & Units 12/10/2020 07/14/2019 03/21/2015  WBC 4.0 - 10.5 K/uL 4.2 7.3 5.7  Hemoglobin 12.0 - 15.0 g/dL 13.0 13.3 13.1  Hematocrit 36.0 - 46.0 %  38.5 40.4 39.3  Platelets 150.0 - 400.0 K/uL 228.0 248 235   Lipid Panel Recent Labs    12/10/20 0925  CHOL 175  TRIG 62.0  LDLCALC 110*  VLDL 12.4  HDL 53.40  CHOLHDL 3    Lipid Panel     Component Value Date/Time   CHOL 175 12/10/2020 0925   CHOL 191 03/05/2020 1021   TRIG 62.0 12/10/2020 0925   HDL 53.40 12/10/2020 0925   HDL 58 03/05/2020 1021   CHOLHDL 3 12/10/2020 0925   VLDL 12.4 12/10/2020 0925   LDLCALC 110 (H) 12/10/2020 0925   LDLCALC 123 (H) 03/05/2020 1021   HEMOGLOBIN A1C No results found for: HGBA1C, MPG TSH Recent Labs    12/10/20 0925  TSH 1.08    External labs:   12/11/2015:  TSH 1.170   08/12/2016: Cholesterol, Total 262 (H) Triglycerides 80  HDL 55  VLDL Cholesterol Cal 16  LDL 191 (H)    Medications and allergies  No Known Allergies   Current Outpatient Medications on File Prior to Visit  Medication Sig Dispense Refill  . atorvastatin (LIPITOR) 20 MG tablet Take 1 tablet (20 mg total) by mouth daily. 90 tablet 3  . cetirizine (ZYRTEC) 10 MG tablet Take 1 tablet by mouth daily as needed for allergies.     . fluticasone (FLONASE) 50 MCG/ACT nasal spray Place into both nostrils daily.    Marland Kitchen lisinopril-hydrochlorothiazide (ZESTORETIC) 20-12.5 MG tablet Take 1 tablet by mouth daily. 90 tablet 3  . Olopatadine HCl 0.2 % SOLN Pataday 0.2 % eye drops  PLACE ONE DROP INTO BOTH EYES DAILY.    . valACYclovir (VALTREX) 500 MG tablet Take 500 mg by mouth daily.    . Vitamin D, Ergocalciferol, (DRISDOL) 1.25 MG (50000 UNIT) CAPS capsule Take 1 capsule (50,000 Units total) by mouth every 7 (seven) days. 5 capsule 2  . WEGOVY 1 MG/0.5ML SOAJ INJECT 1 MG INTO THE SKIN ONCE A WEEK FOR 28 DAYS. 1 mL 1   No current facility-administered medications on file prior to visit.    Radiology:   No results found.  Cardiac Studies:   Exercise Sestamibi Stress Test 12/23/2018: 1. Resting EKG demonstrates normal sinus rhythm.  Stress EKG is equivocal for  ischemia.  Peak exercise there was  1.5 mm upsloping ST segment depression which is back to baseline at less than 2 minutes into recovery.  Stress was terminated due to achieving 89% of MPHR and fatigue.  Patient exercised for 5:00 minutes and achieved 7.03 mets.  Accelerated heart rate response suggest aerobic intolerance.  Exercise tolerance is low for age. 2. Myocardial perfusion imaging is normal. Overall left ventricular systolic function was normal without regional wall motion abnormalities. The left ventricular ejection fraction was 58%.  Low risk study.  Echocardiogram 12/27/2018:  Left ventricle cavity is normal in size. Mild concentric hypertrophy of the left ventricle. Normal global wall motion. Doppler evidence of grade I (impaired) diastolic dysfunction, normal LAP. Calculated EF 60%.  No significant valvular abnormalities.  Inadequate TR jet to estimate pulmonary artery systolic pressure. Normal right atrial pressure.  No significant change compared to previous study on 04/03/2017.   EKG  EKG 03/09/2021: Sinus rhythm with short PR interval heart rate 54 bpm, normal axis, nonspecific T abnormality, cannot exclude septal ischemia.  Diffuse nonspecific T wave flattening. No significant change from 02/26/2020. No significant change from 11/06/2018.   Assessment     ICD-10-CM   1. Essential hypertension  I10 EKG 12-Lead  2. Pure hypercholesterolemia  E78.00 ezetimibe (ZETIA) 10 MG tablet    LDL cholesterol, direct    Lipid Panel With LDL/HDL Ratio  3. Family history of early CAD  Z73.49   61. Abnormal EKG  R94.31      Meds ordered this encounter  Medications  . ezetimibe (ZETIA) 10 MG tablet    Sig: Take 1 tablet (10 mg total) by mouth daily after supper.    Dispense:  90 tablet    Refill:  3    Medications Discontinued During This Encounter  Medication Reason  . ibuprofen (ADVIL) 600 MG tablet Error    Recommendations:   Crystal Shea  is a 50 y.o.  with hyperlipidemia,  hypertension, but a strong family history of premature coronary artery disease in which grandmother had MI in her 38s, 1 sister died at age 101 with CAD and CVA and 2 older sisters both in their 19s had coronary artery disease diagnosis.  She remains asymptomatic, no significant change in her physical exam and EKG is showing short PR interval and nonspecific T abnormalities essentially unchanged from January 2020.  I reviewed her external labs, her LDL is still not at goal. Previously in 2017, her total cholesterol was 262 with an LDL of 191.  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  In view of strong family history of premature coronary disease I would like to get her LDL to at least <100 mg%.  I will add Zetia 10 mg in the evening.  We will check her lipids in 1 month.  Unless she has ongoing new symptoms of chest pain or any other abnormality related to heart, I will see her back on a as needed basis.  Pressure is well controlled.     Adrian Prows, MD, [facility] 03/09/2021, 3:26 PM Office: [phone] Fax: [phone] Pager: [phone]

## 2021-04-12 ENCOUNTER — Other Ambulatory Visit: Payer: Self-pay | Admitting: Family Medicine

## 2021-04-12 DIAGNOSIS — E559 Vitamin D deficiency, unspecified: Secondary | ICD-10-CM

## 2021-04-12 NOTE — Telephone Encounter (Signed)
Per Dr. Loletha Grayer,  12/10/20 "Your Vit D was low at 21. Normal is above 30. I'm going to send an Rx for Vit D 50,000IU 1 cap per week x 12 weeks. After that time you can switch to and OTC Vit D supplement 2000IU daily

## 2021-04-14 ENCOUNTER — Other Ambulatory Visit: Payer: Self-pay | Admitting: Family Medicine

## 2021-04-14 DIAGNOSIS — E559 Vitamin D deficiency, unspecified: Secondary | ICD-10-CM

## 2021-04-14 NOTE — Telephone Encounter (Signed)
If pt has completed 12 weeks of weekly Vit D 50,000IU, the she should start OTC Vit D 2000IU daily

## 2021-04-15 ENCOUNTER — Telehealth: Payer: Self-pay | Admitting: Family Medicine

## 2021-04-15 DIAGNOSIS — E669 Obesity, unspecified: Secondary | ICD-10-CM

## 2021-04-15 NOTE — Telephone Encounter (Signed)
What is the name of the medication? WEGOVY 1 MG/0.5ML SOAJ [[id number]]   Have you contacted your pharmacy to request a refill? She would like a refill on this injection. It sounds like she is having a PA issue. Please advise pt  Which pharmacy would you like this sent to? Pharmacy  [id number] - [place], [address], [place] [address]  Phone:  [phone]  Fax:  [phone]  DEA #:  [id number]     Patient notified that their request is being sent to the clinical staff for review and that they should receive a call once it is complete. If they do not receive a call within 72 hours they can check with their pharmacy or our office.

## 2021-04-20 NOTE — Telephone Encounter (Signed)
Brendell, can you please call pt to f/u on this? Thank you!

## 2021-04-20 NOTE — Telephone Encounter (Signed)
Pt called to follow up on this, She request call back

## 2021-04-21 NOTE — Telephone Encounter (Signed)
PA has been started for pt.

## 2021-04-26 NOTE — Telephone Encounter (Signed)
PA denied due to not meeting requirements of plan.    Plan will cover when you havve met any of these conditions: Lost at least 5 % of your body weight, and continued to keep initial 5 % weight loss off.    Please review and advise.   Thanks. Jaynie Collins, cma

## 2021-04-26 NOTE — Telephone Encounter (Signed)
Appeal initiated today.  Pt aware.

## 2021-05-03 ENCOUNTER — Telehealth: Payer: Self-pay

## 2021-05-03 NOTE — Telephone Encounter (Signed)
Pt calling to see if we have heard back from the prior auth appeal for [facility].  Her call back # [phone]  Thank you

## 2021-05-03 NOTE — Telephone Encounter (Signed)
ERROR

## 2021-05-03 NOTE — Telephone Encounter (Signed)
Error

## 2021-05-20 NOTE — Telephone Encounter (Signed)
Looked in covermymeds and did not see appeal results. Will contact patient when information has been received.

## 2021-05-30 NOTE — Telephone Encounter (Signed)
Patient states she she has been without medication for 1 month and would like to know if she should even continue taking medication after contacting her insurance to see if it is approved?

## 2021-05-30 NOTE — Telephone Encounter (Signed)
Pt called back to follow up on this to see if there was anything new. I also mad sure she was aware that Dr Bryan Lemma is gone and she said she will do some research and get back to [place] on who she wants to see

## 2021-06-01 MED ORDER — SAXENDA 18 MG/3ML ~~LOC~~ SOPN: PEN_INJECTOR | SUBCUTANEOUS | 0 refills | Status: DC

## 2021-06-01 NOTE — Addendum Note (Signed)
Addended by: Leana Gamer on: 06/01/2021 04:42 PM   Modules accepted: Orders

## 2021-06-01 NOTE — Telephone Encounter (Signed)
Pt states she is okay with switching back to medication Saxenda.

## 2021-06-07 ENCOUNTER — Telehealth: Payer: Self-pay

## 2021-06-07 NOTE — Telephone Encounter (Signed)
PA for Saxenda submitted to [facility].  Waiting on response.Dm/cma  Key: [id number]

## 2021-06-10 NOTE — Telephone Encounter (Signed)
Received a denial, but called [facility] '@[phone]'$ [phone] to give additional information.  Spoke to [employer], and she will resubmit new PA, questions answered and will wait for it to be clinically reviewed.  Will receive a fax with results.  Dm/cma

## 2021-06-13 NOTE — Telephone Encounter (Signed)
PA approved from 06/10/21 - 06/10/22. Pharmacy notified VIA.  Dm/cma

## 2021-07-21 NOTE — Telephone Encounter (Signed)
Seems like this is happening the same way as before... pharmacy said it was denied and she needs to contact provider...  Please advise

## 2021-07-21 NOTE — Telephone Encounter (Signed)
Called the pharmacy, its not that she needs a PA.  She is out of refills and need to come in for an appointment.  Tried to call but no VM set up.   Will try later. Dm/cma

## 2021-08-04 ENCOUNTER — Other Ambulatory Visit: Payer: Self-pay

## 2021-08-04 ENCOUNTER — Ambulatory Visit (INDEPENDENT_AMBULATORY_CARE_PROVIDER_SITE_OTHER): Payer: BC Managed Care – PPO | Admitting: Family Medicine

## 2021-08-04 VITALS — BP 116/72 | HR 71 | Temp 97.1°F | Ht 63.0 in | Wt 164.2 lb

## 2021-08-04 DIAGNOSIS — E669 Obesity, unspecified: Secondary | ICD-10-CM

## 2021-08-04 MED ORDER — SAXENDA 18 MG/3ML ~~LOC~~ SOPN: 3.0000 mg | PEN_INJECTOR | SUBCUTANEOUS | 2 refills | Status: DC

## 2021-08-04 NOTE — Progress Notes (Signed)
[facility] [address] [place] [address] Dept: [phone] Dept Fax: [phone]  Office Visit  Subjective:    Patient ID: Crystal Shea, female    DOB: April 23, 1971, 50 y.o..   MRN: [id number]  Chief Complaint  Patient presents with   Follow-up    F/u meds for weight loss.  Needs refills.      History of Present Illness:  Patient is in today for ongoing weight management. Ms. Zavaleta has a history of obesity, with a maximum weight of 197 lbs (BMI 34.97). She has a strong family history of heart disease and diabetes. She has a history of hypertension and hyperlipidemia. She had previously been treated with phentermine for her weight, but failed to have significant loss. She was started initially on semaglutide [facility]) and had a good response, though she did have some associated nausea and memory issues. Due to an insurance issue, she was recently transitioned to liraglutide. She is not up to the 3 mg per week dose. She notes some abdominal cramping associated with this, but it is tolerable. She does not have a specific weight goal, noting she wanted ot do this for her health. She notes her cardiologist feels she is doing much better.  Past Medical History: Patient Active Problem List   Diagnosis Date Noted   Upper respiratory tract infection 10/21/2020   Irregular periods 02/26/2020   Menopausal symptom 02/26/2020   Multiple thyroid nodules 02/26/2020   Atypical chest pain 12/14/2018   Bilateral leg edema 12/14/2018   Family history of early CAD 12/14/2018   Essential hypertension 12/14/2018   Obesity (BMI 30.0-34.9) 01/26/2017   Edema 02/04/2014   Hoarseness 02/04/2014   Seasonal allergic rhinitis 02/04/2014   Seasonal allergies 02/04/2014   Mixed hyperlipidemia 12/11/2012   Past Surgical History:  Procedure Laterality Date   BLADDER REMOVAL     CESAREAN SECTION     x2   HAND SURGERY Left    TUBAL LIGATION      Family History  Problem Relation Age of Onset   Heart disease Mother    Hypertension Mother    Diabetes Mother    Heart attack Mother 39   Heart failure Sister    Heart failure Sister    Heart attack Sister 80   Heart disease Sister    Heart failure Sister    Seizures Sister    Outpatient Medications Prior to Visit  Medication Sig Dispense Refill   atorvastatin (LIPITOR) 20 MG tablet Take 1 tablet (20 mg total) by mouth daily. 90 tablet 3   cetirizine (ZYRTEC) 10 MG tablet Take 1 tablet by mouth daily as needed for allergies.      ezetimibe (ZETIA) 10 MG tablet Take 1 tablet (10 mg total) by mouth daily after supper. 90 tablet 3   fluticasone (FLONASE) 50 MCG/ACT nasal spray Place into both nostrils daily.     lisinopril-hydrochlorothiazide (ZESTORETIC) 20-12.5 MG tablet Take 1 tablet by mouth daily. 90 tablet 3   Olopatadine HCl 0.2 % SOLN Pataday 0.2 % eye drops  PLACE ONE DROP INTO BOTH EYES DAILY.     valACYclovir (VALTREX) 500 MG tablet Take 500 mg by mouth daily.     Liraglutide -Weight Management (SAXENDA) 18 MG/3ML SOPN Start at 1.6mg  once a day, titrate up by 0.6mg  every 7days till you reach 3mg . Need appt with new pcp for additional refills 15 mL 0   Vitamin D, Ergocalciferol, (DRISDOL) 1.25 MG (50000 UNIT) CAPS capsule  Take 1 capsule (50,000 Units total) by mouth every 7 (seven) days. (Patient not taking: Reported on 08/04/2021) 5 capsule 2   No facility-administered medications prior to visit.   No Known Allergies    Objective:   Today's Vitals   08/04/21 1118  BP: 116/72  Pulse: 71  Temp: (!) 97.1 F (36.2 C)  TempSrc: Temporal  SpO2: 99%  Weight: 164 lb 3.2 oz (74.5 kg)  Height: 5\' 3"  (1.6 m)   Body mass index is 29.09 kg/m.   General: Well developed, well nourished. No acute distress. Psych: Alert and oriented. Normal mood and affect.  Health Maintenance Due  Topic Date Due   HIV Screening  Never done   TETANUS/TDAP  Never done   PAP  SMEAR-Modifier  Never done   COLONOSCOPY (Pts 45-11yrs Insurance coverage will need to be confirmed)  Never done   COVID-19 Vaccine (4 - Booster for Moderna series) 12/01/2020   INFLUENZA VACCINE  05/16/2021     Assessment & Plan:   1. Obesity (BMI 30.0-34.9) Ms. Farner has lost 33 lbs (16.75%) since last Nov. Her BMI is now under 30. We will continue the liraglutide for now and reassess at 12 weeks to see if she experiences any further weight loss.  - Liraglutide -Weight Management (SAXENDA) 18 MG/3ML SOPN; Inject 3 mg into the skin once a week.  Dispense: 3 mL; Refill: 2  [name], MD

## 2021-10-28 ENCOUNTER — Encounter: Payer: Self-pay | Admitting: Family Medicine

## 2021-10-28 ENCOUNTER — Ambulatory Visit (INDEPENDENT_AMBULATORY_CARE_PROVIDER_SITE_OTHER): Payer: BC Managed Care – PPO | Admitting: Family Medicine

## 2021-10-28 ENCOUNTER — Other Ambulatory Visit: Payer: Self-pay

## 2021-10-28 VITALS — BP 122/70 | HR 59 | Temp 97.1°F | Ht 63.0 in | Wt 161.2 lb

## 2021-10-28 DIAGNOSIS — I10 Essential (primary) hypertension: Secondary | ICD-10-CM | POA: Diagnosis not present

## 2021-10-28 DIAGNOSIS — Z23 Encounter for immunization: Secondary | ICD-10-CM

## 2021-10-28 DIAGNOSIS — R7303 Prediabetes: Secondary | ICD-10-CM | POA: Insufficient documentation

## 2021-10-28 DIAGNOSIS — H1013 Acute atopic conjunctivitis, bilateral: Secondary | ICD-10-CM | POA: Insufficient documentation

## 2021-10-28 DIAGNOSIS — E782 Mixed hyperlipidemia: Secondary | ICD-10-CM | POA: Diagnosis not present

## 2021-10-28 DIAGNOSIS — E669 Obesity, unspecified: Secondary | ICD-10-CM | POA: Diagnosis not present

## 2021-10-28 DIAGNOSIS — K5790 Diverticulosis of intestine, part unspecified, without perforation or abscess without bleeding: Secondary | ICD-10-CM | POA: Insufficient documentation

## 2021-10-28 DIAGNOSIS — B009 Herpesviral infection, unspecified: Secondary | ICD-10-CM | POA: Insufficient documentation

## 2021-10-28 LAB — BASIC METABOLIC PANEL
BUN: 15 mg/dL (ref 6–23)
CO2: 32 mEq/L (ref 19–32)
Calcium: 9.3 mg/dL (ref 8.4–10.5)
Chloride: 102 mEq/L (ref 96–112)
Creatinine, Ser: 0.86 mg/dL (ref 0.40–1.20)
GFR: 79.48 mL/min (ref >60.00)
Glucose, Bld: 80 mg/dL (ref 70–99)
Potassium: 3.6 mEq/L (ref 3.5–5.1)
Sodium: 141 mEq/L (ref 135–145)

## 2021-10-28 LAB — LIPID PANEL
Cholesterol: 187 mg/dL (ref 0–200)
HDL: 60.5 mg/dL (ref >39.00)
LDL Cholesterol: 117 mg/dL — ABNORMAL HIGH (ref 0–99)
NonHDL: 126.36
Total CHOL/HDL Ratio: 3
Triglycerides: 47 mg/dL (ref 0.0–149.0)
VLDL: 9.4 mg/dL (ref 0.0–40.0)

## 2021-10-28 LAB — HEMOGLOBIN A1C: Hgb A1c MFr Bld: 5.9 % (ref 4.6–6.5)

## 2021-10-28 MED ORDER — OLOPATADINE HCL 0.2 % OP SOLN: OPHTHALMIC | 11 refills | Status: DC

## 2021-10-28 NOTE — Progress Notes (Signed)
[facility] [address] [place] [address] Dept: [phone] Dept Fax: [phone]  Transfer of Care Office Visit  Subjective:    Patient ID: Crystal Shea, female    DOB: 18-Jul-1971, 51 y.o..   MRN: [id number]  Chief Complaint  Patient presents with   Establish Care    [facility]- establish care.   No concerns. Wants flu shot today.  Fasting today.     History of Present Illness:  Patient is in today to establish care. Crystal Shea was born in [place], [place] and grew up in [place]. She moved to [place] in 1992. She was married for 23 years, but is now divorced. She ahs four children (20-32). After her divorce, she attended [employer], receiving a BS in criminal justice and a Masters in [employer]. 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 She denies tobacco and drug use. She rarely drinks alcohol.  Crystal Shea has a history of obesity. Due to a strong family history of diabetes, stroke, and heart disease, she has been working at weight loss. She had previously been treated with phentermine for her weight, but failed to have significant loss. She was started initially on semaglutide [facility]) and had a good response, though she did have some associated nausea and memory issues. Due to an insurance issue, she was then transitioned to liraglutide. She was surprised to see she had some ongoing weight loss today (36 lbs so far).  Crystal Shea has a history of hyperlipidemia. She is managed on atorvastatin. She was previously on Zetia, but no longer takes this.   Crystal Shea has a history of hypertension. She is managed on Zestoretic.  Crystal Shea has a history of allergic rhinitis and conjunctiviits. She is managed on PRN Zyrtec and Patadine eye drops.  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 She uses Valtrex as needed for outbreaks.  Past Medical History: Patient Active Problem List    Diagnosis Date Noted   Diverticulosis 10/28/2021   Irregular periods 02/26/2020   Menopausal symptom 02/26/2020   Multiple thyroid nodules 02/26/2020   Essential hypertension 12/14/2018   Obesity (BMI 30.0-34.9) 01/26/2017   Hoarseness 02/04/2014   Seasonal allergic rhinitis 02/04/2014   Mixed hyperlipidemia 12/11/2012   Past Surgical History:  Procedure Laterality Date   CESAREAN SECTION     x2   CHOLECYSTECTOMY, LAPAROSCOPIC     OPEN REDUCTION INTERNAL FIXATION (ORIF) METACARPAL Left    5th   TUBAL LIGATION     Family History  Problem Relation Age of Onset   Heart disease Mother    Hypertension Mother    Diabetes Mother    Heart attack Mother 43   COPD Father    Diabetes Sister    Heart disease Sister    Stroke Sister    Cancer Sister        Kidney   Heart disease Sister    Heart failure Sister    Heart attack Sister 27   Cancer Sister        Kidney   Heart disease Sister    Heart failure Sister    Seizures Sister    Stroke Maternal Grandmother    Diabetes Maternal Grandmother    Diabetes Maternal Grandfather    Heart disease Paternal Grandmother    Outpatient Medications Prior to Visit  Medication Sig Dispense Refill   atorvastatin (LIPITOR) 20 MG tablet Take 1 tablet (20 mg total) by mouth daily.  90 tablet 3   cetirizine (ZYRTEC) 10 MG tablet Take 1 tablet by mouth daily as needed for allergies.      ezetimibe (ZETIA) 10 MG tablet Take 1 tablet (10 mg total) by mouth daily after supper. 90 tablet 3   fluticasone (FLONASE) 50 MCG/ACT nasal spray Place into both nostrils daily.     Liraglutide -Weight Management (SAXENDA) 18 MG/3ML SOPN Inject 3 mg into the skin once a week. 3 mL 2   lisinopril-hydrochlorothiazide (ZESTORETIC) 20-12.5 MG tablet Take 1 tablet by mouth daily. 90 tablet 3   Olopatadine HCl 0.2 % SOLN Pataday 0.2 % eye drops  PLACE ONE DROP INTO BOTH EYES DAILY.     valACYclovir (VALTREX) 500 MG tablet Take 500 mg by mouth daily.     No  facility-administered medications prior to visit.   No Known Allergies    Objective:   Today's Vitals   10/28/21 1000  BP: 122/70  Pulse: (!) 59  Temp: (!) 97.1 F (36.2 C)  TempSrc: Temporal  SpO2: 98%  Weight: 161 lb 3.2 oz (73.1 kg)  Height: 5\' 3"  (1.6 m)   Body mass index is 28.56 kg/m.   General: Well developed, well nourished. No acute distress. Psych: Alert and oriented. Normal mood and affect.  Health Maintenance Due  Topic Date Due   HIV Screening  Never done   TETANUS/TDAP  Never done   PAP SMEAR-Modifier  Never done   COLONOSCOPY (Pts 45-16yrs Insurance coverage will need to be confirmed)  Never done   COVID-19 Vaccine (4 - Booster for Moderna series) 12/01/2020   INFLUENZA VACCINE  05/16/2021     Assessment & Plan:   1. Obesity (BMI 30.0-34.9) Weight is down 36 lbs. Her obesity has been complicated by hypertension and hyperlipidemia. She will continue on liraglutide. I will check her again in 3 months.  - Hemoglobin A1c  2. Essential hypertension Blood pressure is at goal today. I will check her renal function and electrolytes.  - Basic metabolic panel  3. Mixed hyperlipidemia Due for reassessment of lipids. Continue atorvastatin.  - Lipid panel  4. Need for influenza vaccination  - Flu Vaccine QUAD 6+ mos PF IM (Fluarix Quad PF)  5. Need for Tdap vaccination  - Tdap vaccine greater than or equal to 7yo IM  Crystal Salter, MD

## 2021-11-30 ENCOUNTER — Other Ambulatory Visit: Payer: Self-pay | Admitting: Family Medicine

## 2021-11-30 DIAGNOSIS — E669 Obesity, unspecified: Secondary | ICD-10-CM

## 2022-01-26 ENCOUNTER — Ambulatory Visit: Payer: BC Managed Care – PPO | Admitting: Family Medicine

## 2022-01-26 VITALS — BP 122/78 | HR 64 | Temp 97.0°F | Ht 63.0 in | Wt 166.6 lb

## 2022-01-26 DIAGNOSIS — E663 Overweight: Secondary | ICD-10-CM | POA: Diagnosis not present

## 2022-01-26 DIAGNOSIS — I10 Essential (primary) hypertension: Secondary | ICD-10-CM | POA: Diagnosis not present

## 2022-01-26 DIAGNOSIS — E782 Mixed hyperlipidemia: Secondary | ICD-10-CM | POA: Diagnosis not present

## 2022-01-26 DIAGNOSIS — M79672 Pain in left foot: Secondary | ICD-10-CM

## 2022-01-26 DIAGNOSIS — L84 Corns and callosities: Secondary | ICD-10-CM

## 2022-01-26 DIAGNOSIS — M79671 Pain in right foot: Secondary | ICD-10-CM

## 2022-01-26 MED ORDER — ATORVASTATIN CALCIUM 40 MG PO TABS: 40.0000 mg | ORAL_TABLET | Freq: Every day | ORAL | 3 refills | Status: DC

## 2022-01-26 NOTE — Progress Notes (Signed)
?[facility] ?[address] [place] ?[place] [address] ?Dept: [phone] ?Dept Fax: [phone] ? ?Chronic Care Office Visit ? ?Subjective:  ? ? Patient ID: Crystal Shea, female    DOB: [birth year], 51 y.o..   MRN: [id number] ? ?Chief Complaint  ?Patient presents with  ? Follow-up  ?  3 month f/u.  Not fasting today.   ? ? ?History of Present Illness: ? ?Patient is in today for reassessment of chronic medical issues. ? ?Crystal Shea has a history of obesity. Due to a strong family history of diabetes, stroke, and heart disease, she has been working at weight loss. She had previously been treated with phentermine for her weight, but failed to have significant loss. She was started initially on semaglutide [facility]) and had a good response, though she did have some associated nausea and memory issues. Due to an insurance issue, she was then transitioned to liraglutide. She has noted an increase in appetite more recently that she thinks may have contributed to some mild weight gain. She is exercising 3 days a week, walking in her neighborhood for about 2 miles. This typically takes about 30 minutes. ?  ?Crystal Shea has a history of hyperlipidemia. She is managed on atorvastatin. She was previously on Zetia, but no longer takes this. At her last visit, we increased her atorvastatin dose to 40 mg. ?  ?Crystal Shea has a history of hypertension. She is managed on Zestoretic. ?  ?Crystal Shea notes she has multiple corns on her feet and has for quite some time. More recently, she has started to have an issue with p[ins and needles sensation in her distal foot. ? ?Past Medical History: ?Patient Active Problem List  ? Diagnosis Date Noted  ? Diverticulosis 10/28/2021  ? 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 Allergic conjunctivitis of both eyes 10/28/2021  ? Prediabetes 10/28/2021  ? Irregular periods 02/26/2020  ? Menopausal symptom 02/26/2020  ? Multiple thyroid nodules 02/26/2020  ?  Essential hypertension 12/14/2018  ? Overweight (BMI 25.0-29.9) 01/26/2017  ? Hoarseness 02/04/2014  ? Seasonal allergic rhinitis 02/04/2014  ? Mixed hyperlipidemia 12/11/2012  ? ?Past Surgical History:  ?Procedure Laterality Date  ? CESAREAN SECTION    ? x2  ? CHOLECYSTECTOMY, LAPAROSCOPIC    ? OPEN REDUCTION INTERNAL FIXATION (ORIF) METACARPAL Left   ? 5th  ? TUBAL LIGATION    ? ?Family History  ?Problem Relation Age of Onset  ? Heart disease Mother   ? Hypertension Mother   ? Diabetes Mother   ? Heart attack Mother 65  ? COPD Father   ? Diabetes Sister   ? Heart disease Sister   ? Stroke Sister   ? Cancer Sister   ?     Kidney  ? Heart disease Sister   ? Heart failure Sister   ? Heart attack Sister 65  ? Cancer Sister   ?     Kidney  ? Heart disease Sister   ? Heart failure Sister   ? Seizures Sister   ? Stroke Maternal Grandmother   ? Diabetes Maternal Grandmother   ? Diabetes Maternal Grandfather   ? Heart disease Paternal Grandmother   ? ?Outpatient Medications Prior to Visit  ?Medication Sig Dispense Refill  ? cetirizine (ZYRTEC) 10 MG tablet Take 1 tablet by mouth daily as needed for allergies.     ? ezetimibe (ZETIA) 10 MG tablet Take 1 tablet (10 mg total) by mouth daily after supper. 90 tablet 3  ?  fluticasone (FLONASE) 50 MCG/ACT nasal spray Place into both nostrils daily.    ? lisinopril-hydrochlorothiazide (ZESTORETIC) 20-12.5 MG tablet Take 1 tablet by mouth daily. 90 tablet 3  ? Olopatadine HCl 0.2 % SOLN Pataday 0.2 % eye drops  PLACE ONE DROP INTO BOTH EYES DAILY. 2.5 mL 11  ? SAXENDA 18 MG/3ML SOPN INJECT 3 MG INTO THE SKIN ONCE A WEEK. 3 mL 2  ? valACYclovir (VALTREX) 500 MG tablet Take 500 mg by mouth daily.    ? atorvastatin (LIPITOR) 20 MG tablet Take 1 tablet (20 mg total) by mouth daily. 90 tablet 3  ? ?No facility-administered medications prior to visit.  ? ?No Known Allergies ?   ?Objective:  ? ?Today's Vitals  ? 01/26/22 0921  ?BP: 122/78  ?Pulse: 64  ?Temp: (!) 97 ?F (36.1 ?C)  ?TempSrc:  Temporal  ?SpO2: 97%  ?Weight: 166 lb 9.6 oz (75.6 kg)  ?Height: '5\' 3"'$  (1.6 m)  ? ?Body mass index is 29.51 kg/m?.  ? ?General: Well developed, well nourished. No acute distress. ?Feet: There are multiple discrete raised plaques on several of the toes along the extensor surface.  ? There are also some discrete small areas oc callus formation on the sole of the foot near the ? metatarsal heads. No pain on palpation noted. ?Psych: Alert and oriented. Normal mood and affect. ? ?Health Maintenance Due  ?Topic Date Due  ? HIV Screening  Never done  ? COLONOSCOPY (Pts 45-69yr Insurance coverage will need to be confirmed)  Never done  ? ?Lab Results ?Last lipids ?Lab Results  ?Component Value Date  ? CHOL 187 10/28/2021  ? HDL 60.50 10/28/2021  ? LDLCALC 117 (H) 10/28/2021  ? TRIG 47.0 10/28/2021  ? CHOLHDL 3 10/28/2021  ? ?Last hemoglobin A1c ?Lab Results  ?Component Value Date  ? HGBA1C 5.9 10/28/2021  ? ?Assessment & Plan:  ? ?1. Overweight (BMI 25.0-29.9) ?Maximum weight : 197 lbs. ?Current weight loss: 31 lbs. ? ?Ms. HDaughtreywill continue 3 mg of liraglutide daily. I recommend she target getting at least 150 minutes of moderate-intensity exercise weekly, (I.e., add in 2 more walks each week). We discussed the challenges of maintaining her weight loss once she stops use of medication. ? ?2. Essential hypertension ?Blood pressure is at goal. Continue Zestoretic 20-12.5 mg daily ? ?3. Mixed hyperlipidemia ?Due for repeat lipids to see if the higher atorvastatin dose has her at goal. ? ?- Lipid panel; Future ?- atorvastatin (LIPITOR) 40 MG tablet; Take 1 tablet (40 mg total) by mouth daily.  Dispense: 90 tablet; Refill: 3 ? ?4. Corns ?Recommend use of corn pads. I will refer her to podiatry to discuss potential orthotics or other approaches to improve these. ? ?- Ambulatory referral to Podiatry ? ?5. Pain in both feet ?The pain in the distal toes is uncertain. I will have podiatry assess. ? ?- Ambulatory referral to  Podiatry ? ? ?Return in about 3 months (around 04/27/2022).  ? ?SHaydee Salter MD ?

## 2022-01-27 ENCOUNTER — Other Ambulatory Visit (INDEPENDENT_AMBULATORY_CARE_PROVIDER_SITE_OTHER): Payer: BC Managed Care – PPO

## 2022-01-27 ENCOUNTER — Encounter: Payer: Self-pay | Admitting: Family Medicine

## 2022-01-27 DIAGNOSIS — E782 Mixed hyperlipidemia: Secondary | ICD-10-CM

## 2022-01-27 LAB — LIPID PANEL
Cholesterol: 188 mg/dL (ref 0–200)
HDL: 55.1 mg/dL (ref >39.00)
LDL Cholesterol: 124 mg/dL — ABNORMAL HIGH (ref 0–99)
NonHDL: 132.94
Total CHOL/HDL Ratio: 3
Triglycerides: 47 mg/dL (ref 0.0–149.0)
VLDL: 9.4 mg/dL (ref 0.0–40.0)

## 2022-02-20 ENCOUNTER — Other Ambulatory Visit: Payer: Self-pay

## 2022-02-20 DIAGNOSIS — I10 Essential (primary) hypertension: Secondary | ICD-10-CM

## 2022-02-20 MED ORDER — LISINOPRIL-HYDROCHLOROTHIAZIDE 20-12.5 MG PO TABS: 1.0000 | ORAL_TABLET | Freq: Every day | ORAL | 1 refills | Status: DC

## 2022-02-26 ENCOUNTER — Encounter (HOSPITAL_COMMUNITY): Payer: Self-pay

## 2022-02-26 ENCOUNTER — Emergency Department (HOSPITAL_COMMUNITY): Payer: BC Managed Care – PPO

## 2022-02-26 ENCOUNTER — Emergency Department (HOSPITAL_COMMUNITY)
Admission: EM | Admit: 2022-02-26 | Discharge: 2022-02-26 | Disposition: A | Discharge status: Home / Self Care | Payer: BC Managed Care – PPO | Attending: Emergency Medicine | Admitting: Emergency Medicine

## 2022-02-26 DIAGNOSIS — R1084 Generalized abdominal pain: Secondary | ICD-10-CM | POA: Diagnosis present

## 2022-02-26 DIAGNOSIS — R112 Nausea with vomiting, unspecified: Secondary | ICD-10-CM | POA: Insufficient documentation

## 2022-02-26 DIAGNOSIS — R197 Diarrhea, unspecified: Secondary | ICD-10-CM | POA: Insufficient documentation

## 2022-02-26 DIAGNOSIS — I10 Essential (primary) hypertension: Secondary | ICD-10-CM | POA: Insufficient documentation

## 2022-02-26 DIAGNOSIS — E785 Hyperlipidemia, unspecified: Secondary | ICD-10-CM | POA: Insufficient documentation

## 2022-02-26 LAB — CBC WITH DIFFERENTIAL/PLATELET
Abs Immature Granulocytes: 0.03 10*3/uL (ref 0.00–0.07)
Basophils Absolute: 0 10*3/uL (ref 0.0–0.1)
Basophils Relative: 0 %
Eosinophils Absolute: 0 10*3/uL (ref 0.0–0.5)
Eosinophils Relative: 0 %
HCT: 45.1 % (ref 36.0–46.0)
Hemoglobin: 15.2 g/dL — ABNORMAL HIGH (ref 12.0–15.0)
Immature Granulocytes: 1 %
Lymphocytes Relative: 16 %
Lymphs Abs: 1 10*3/uL (ref 0.7–4.0)
MCH: 29.6 pg (ref 26.0–34.0)
MCHC: 33.7 g/dL (ref 30.0–36.0)
MCV: 87.9 fL (ref 80.0–100.0)
Monocytes Absolute: 0.3 10*3/uL (ref 0.1–1.0)
Monocytes Relative: 4 %
Neutro Abs: 5.2 10*3/uL (ref 1.7–7.7)
Neutrophils Relative %: 79 %
Platelets: 256 10*3/uL (ref 150–400)
RBC: 5.13 MIL/uL — ABNORMAL HIGH (ref 3.87–5.11)
RDW: 13.2 % (ref 11.5–15.5)
WBC: 6.5 10*3/uL (ref 4.0–10.5)
nRBC: 0 % (ref 0.0–0.2)

## 2022-02-26 LAB — COMPREHENSIVE METABOLIC PANEL
ALT: 20 U/L (ref 0–44)
AST: 22 U/L (ref 15–41)
Albumin: 4.4 g/dL (ref 3.5–5.0)
Alkaline Phosphatase: 92 U/L (ref 38–126)
Anion gap: 8 (ref 5–15)
BUN: 23 mg/dL — ABNORMAL HIGH (ref 6–20)
CO2: 29 mmol/L (ref 22–32)
Calcium: 9.5 mg/dL (ref 8.9–10.3)
Chloride: 105 mmol/L (ref 98–111)
Creatinine, Ser: 0.9 mg/dL (ref 0.44–1.00)
GFR, Estimated: >60 mL/min (ref >60)
Glucose, Bld: 91 mg/dL (ref 70–99)
Potassium: 3.6 mmol/L (ref 3.5–5.1)
Sodium: 142 mmol/L (ref 135–145)
Total Bilirubin: 0.7 mg/dL (ref 0.3–1.2)
Total Protein: 8.9 g/dL — ABNORMAL HIGH (ref 6.5–8.1)

## 2022-02-26 LAB — URINALYSIS, ROUTINE W REFLEX MICROSCOPIC
Bacteria, UA: NONE SEEN
Bilirubin Urine: NEGATIVE
Glucose, UA: NEGATIVE mg/dL
Ketones, ur: NEGATIVE mg/dL
Leukocytes,Ua: NEGATIVE
Nitrite: NEGATIVE
Protein, ur: NEGATIVE mg/dL
Specific Gravity, Urine: 1.018 (ref 1.005–1.030)
pH: 5 (ref 5.0–8.0)

## 2022-02-26 LAB — POC OCCULT BLOOD, ED: Fecal Occult Bld: NEGATIVE

## 2022-02-26 LAB — PREGNANCY, URINE: Preg Test, Ur: NEGATIVE

## 2022-02-26 LAB — LIPASE, BLOOD: Lipase: 45 U/L (ref 11–51)

## 2022-02-26 MED ORDER — ONDANSETRON 4 MG PO TBDP: 4.0000 mg | ORAL_TABLET | Freq: Three times a day (TID) | ORAL | 0 refills | Status: DC | PRN

## 2022-02-26 MED ORDER — DICYCLOMINE HCL 20 MG PO TABS: 20.0000 mg | ORAL_TABLET | Freq: Two times a day (BID) | ORAL | 0 refills | Status: DC

## 2022-02-26 MED ORDER — SODIUM CHLORIDE 0.9 % IV BOLUS
1000.0000 mL | Freq: Once | INTRAVENOUS | Status: AC
  Administered 2022-02-26: 1000 mL via INTRAVENOUS

## 2022-02-26 MED ORDER — FENTANYL CITRATE PF 50 MCG/ML IJ SOSY
25.0000 ug | PREFILLED_SYRINGE | Freq: Once | INTRAMUSCULAR | Status: AC
  Administered 2022-02-26: 25 ug via INTRAVENOUS
  Filled 2022-02-26: qty 1

## 2022-02-26 MED ORDER — IOHEXOL 300 MG/ML  SOLN
100.0000 mL | Freq: Once | INTRAMUSCULAR | Status: AC | PRN
  Administered 2022-02-26: 100 mL via INTRAVENOUS

## 2022-02-26 MED ORDER — DICYCLOMINE HCL 10 MG PO CAPS
10.0000 mg | ORAL_CAPSULE | Freq: Once | ORAL | Status: AC
  Administered 2022-02-26: 10 mg via ORAL
  Filled 2022-02-26: qty 1

## 2022-02-26 NOTE — Discharge Instructions (Signed)
Please take medications as prescribed.  I would like for you to follow-up with your primary care doctor like we discussed.  Return to the emergency department for any worsening symptoms you might have. ?

## 2022-02-26 NOTE — ED Provider Notes (Signed)
?[facility] DEPT ?Provider Note ? ? ?CSN: [id number] ?Arrival date & time: 02/26/22  7672 ? ?  ? ?History ?Chief Complaint  ?Patient presents with  ? Abdominal Pain  ? Emesis  ? ? ?Crystal Shea is a 51 y.o. female with history of diverticulitis and colitis who presents to the emergency department today with diffuse sharp stabbing abdominal pain that started around 3 AM.  This woke her up from sleep.  Patient immediately had the urge to have a bowel movement and had watery diarrhea initially.  Subsequent bowel movements did have gross bright red blood.  Pain has been constant since onset.  She is also been having some vomiting and nausea.  No fever or chills.  No urinary complaints.  No cough, congestion, sore throat. ? ?Old records were reviewed which did show similar symptoms approximately 2 years ago.  When I asked the patient if this felt similar she did agree.  Patient has not had a colonoscopy on record that I can find. ? ? ?Abdominal Pain ?Associated symptoms: vomiting   ?Emesis ?Associated symptoms: abdominal pain   ? ?  ? ?Home Medications ?Prior to Admission medications   ?Medication Sig Start Date End Date Taking? Authorizing Provider  ?atorvastatin (LIPITOR) 40 MG tablet Take 1 tablet (40 mg total) by mouth daily. 01/26/22  Yes [name], MD  ?cetirizine (ZYRTEC) 10 MG tablet Take 10-20 mg by mouth daily as needed for allergies. 01/24/17  Yes [provider]  ?dicyclomine (BENTYL) 20 MG tablet Take 1 tablet (20 mg total) by mouth 2 (two) times daily. 02/26/22  Yes Hendricks Limes, PA-C  ?Olopatadine HCl 0.2 % SOLN Pataday 0.2 % eye drops  PLACE ONE DROP INTO BOTH EYES DAILY. ?Patient taking differently: Place 1 drop into both eyes daily as needed (allergies). 10/28/21  Yes [name], MD  ?ondansetron (ZOFRAN-ODT) 4 MG disintegrating tablet Take 1 tablet (4 mg total) by mouth every 8 (eight) hours as needed for nausea or vomiting. 02/26/22  Yes Hendricks Limes, PA-C  ?SAXENDA 18 MG/3ML SOPN INJECT 3 MG INTO THE SKIN ONCE A WEEK. ?Patient taking differently: Inject 3 mg into the skin daily. 11/30/21  Yes [name], MD  ?Tetrahydrozoline HCl (VISINE OP) Place 1 drop into both eyes daily. For redness   Yes [provider]  ?VITAMIN D PO Take 2 tablets by mouth daily.   Yes [provider]  ?ezetimibe (ZETIA) 10 MG tablet Take 1 tablet (10 mg total) by mouth daily after supper. ?Patient not taking: Reported on 02/26/2022 03/09/21 03/04/22  Adrian Prows, MD  ?fluticasone [facility]) 50 MCG/ACT nasal spray Place into both nostrils daily. ?Patient not taking: Reported on 02/26/2022    [provider]  ?lisinopril-hydrochlorothiazide (ZESTORETIC) 20-12.5 MG tablet Take 1 tablet by mouth daily. ?Patient not taking: Reported on 02/26/2022 02/20/22   [name], MD  ?valACYclovir (VALTREX) 500 MG tablet Take 500 mg by mouth daily. ?Patient not taking: Reported on 02/26/2022 08/23/20   [provider]  ?   ? ?Allergies    ?Patient has no known allergies.   ? ?Review of Systems   ?Review of Systems  ?Gastrointestinal:  Positive for abdominal pain and vomiting.  ?All other systems reviewed and are negative. ? ?Physical Exam ?Updated Vital Signs ?BP 118/77   Pulse 67   Temp 98.4 ?F (36.9 ?C) (Oral)   Resp 15   LMP 02/03/2015 (Approximate)   SpO2 100%  ?Physical  Exam ?Vitals and nursing note reviewed. Exam conducted with a chaperone present.  ?Constitutional:   ?   General: She is not in acute distress. ?   Appearance: Normal appearance.  ?HENT:  ?   Head: Normocephalic and atraumatic.  ?Eyes:  ?   General:     ?   Right eye: No discharge.     ?   Left eye: No discharge.  ?Cardiovascular:  ?   Comments: Regular rate and rhythm.  S1/S2 are distinct without any evidence of murmur, rubs, or gallops.  Radial pulses are 2+ bilaterally.  Dorsalis pedis pulses are 2+ bilaterally.  No evidence of pedal edema. ?Pulmonary:  ?   Comments: Clear to  auscultation bilaterally.  Normal effort.  No respiratory distress.  No evidence of wheezes, rales, or rhonchi heard throughout. ?Abdominal:  ?   General: Abdomen is flat. Bowel sounds are normal. There is no distension.  ?   Tenderness: There is generalized abdominal tenderness. There is no guarding or rebound.  ?Genitourinary: ?   Comments: External rectum was normal.  No evidence of skin tags, anal prolapse, fissures, or hemorrhoids.  No evidence of internal hemorrhoids, anal fissures, or mass.  Patient had normal rectal tone. ?Musculoskeletal:     ?   General: Normal range of motion.  ?   Cervical back: Neck supple.  ?Skin: ?   General: Skin is warm and dry.  ?   Findings: No rash.  ?Neurological:  ?   General: No focal deficit present.  ?   Mental Status: She is alert.  ?Psychiatric:     ?   Mood and Affect: Mood normal.     ?   Behavior: Behavior normal.  ? ? ?ED Results / Procedures / Treatments   ?Labs ?(all labs ordered are listed, but only abnormal results are displayed) ?Labs Reviewed  ?COMPREHENSIVE METABOLIC PANEL - Abnormal; Notable for the following components:  ?    Result Value  ? BUN 23 (*)   ? Total Protein 8.9 (*)   ? All other components within normal limits  ?CBC WITH DIFFERENTIAL/PLATELET - Abnormal; Notable for the following components:  ? RBC 5.13 (*)   ? Hemoglobin 15.2 (*)   ? All other components within normal limits  ?URINALYSIS, ROUTINE W REFLEX MICROSCOPIC - Abnormal; Notable for the following components:  ? APPearance HAZY (*)   ? Hgb urine dipstick SMALL (*)   ? All other components within normal limits  ?GASTROINTESTINAL PANEL BY PCR, STOOL (REPLACES STOOL CULTURE)  ?LIPASE, BLOOD  ?PREGNANCY, URINE  ?POC OCCULT BLOOD, ED  ? ? ?EKG ?None ? ?Radiology ?CT ABDOMEN PELVIS W CONTRAST ? ?Result Date: 02/26/2022 ?CLINICAL DATA:  Diffuse abdominal pain with vomiting and bloody stools. EXAM: CT ABDOMEN AND PELVIS WITH CONTRAST TECHNIQUE: Multidetector CT imaging of the abdomen and pelvis was  performed using the standard protocol following bolus administration of intravenous contrast. RADIATION DOSE REDUCTION: This exam was performed according to the departmental dose-optimization program which includes automated exposure control, adjustment of the mA and/or kV according to patient size and/or use of iterative reconstruction technique. CONTRAST:  165m OMNIPAQUE IOHEXOL 300 MG/ML  SOLN COMPARISON:  07/15/2019 FINDINGS: Lower chest: Unremarkable. Hepatobiliary: 17 mm subcapsular lesion posterior right hepatic lobe on 17/2 is new in the interval. 13 mm hypoattenuating lesion inferior right liver on 21/2 is stable. Gallbladder surgically absent. No intrahepatic or extrahepatic biliary dilation. Pancreas: No focal mass lesion. No dilatation of the main duct. No intraparenchymal cyst.  No peripancreatic edema. Spleen: No splenomegaly. No focal mass lesion. Adrenals/Urinary Tract: No adrenal nodule or mass. Kidneys unremarkable. No evidence for hydroureter. The urinary bladder appears normal for the degree of distention. Stomach/Bowel: Stomach is unremarkable. No gastric wall thickening. No evidence of outlet obstruction. Duodenum is normally positioned as is the ligament of Treitz. No small bowel wall thickening. No small bowel dilatation. The terminal ileum is normal. The appendix is best seen on coronal images and is unremarkable. No gross colonic mass. No colonic wall thickening. Colon is diffusely decompressed. Vascular/Lymphatic: There is mild atherosclerotic calcification of the abdominal aorta without aneurysm. There is no gastrohepatic or hepatoduodenal ligament lymphadenopathy. No retroperitoneal or mesenteric lymphadenopathy. No pelvic sidewall lymphadenopathy. Reproductive: Uterine fibroids again noted, similar to prior. There is no adnexal mass. Other: No intraperitoneal free fluid. Musculoskeletal: No worrisome lytic or sclerotic osseous abnormality. IMPRESSION: 1. No acute findings in the abdomen  or pelvis. Specifically, no findings to explain the patient's history of abdominal pain and bloody stools. 2. 17 mm subcapsular lesion posterior right hepatic lobe, new in the interval. This is indeterminate but

## 2022-02-26 NOTE — ED Triage Notes (Signed)
Pt arrived via POV, c/o diffuse abd pain, vomiting and blood in stool since 3am.  ?

## 2022-02-27 ENCOUNTER — Ambulatory Visit (INDEPENDENT_AMBULATORY_CARE_PROVIDER_SITE_OTHER): Payer: BC Managed Care – PPO | Admitting: Family Medicine

## 2022-02-27 ENCOUNTER — Ambulatory Visit: Payer: BC Managed Care – PPO | Admitting: Podiatry

## 2022-02-27 VITALS — BP 132/76 | HR 70 | Temp 97.4°F | Ht 63.0 in | Wt 161.8 lb

## 2022-02-27 DIAGNOSIS — R1032 Left lower quadrant pain: Secondary | ICD-10-CM

## 2022-02-27 DIAGNOSIS — R160 Hepatomegaly, not elsewhere classified: Secondary | ICD-10-CM

## 2022-02-27 DIAGNOSIS — D259 Leiomyoma of uterus, unspecified: Secondary | ICD-10-CM | POA: Insufficient documentation

## 2022-02-27 DIAGNOSIS — K625 Hemorrhage of anus and rectum: Secondary | ICD-10-CM | POA: Diagnosis not present

## 2022-02-27 DIAGNOSIS — I700 Atherosclerosis of aorta: Secondary | ICD-10-CM | POA: Insufficient documentation

## 2022-02-27 DIAGNOSIS — D1803 Hemangioma of intra-abdominal structures: Secondary | ICD-10-CM | POA: Insufficient documentation

## 2022-02-27 MED ORDER — HYDROCODONE-ACETAMINOPHEN 10-325 MG PO TABS: 1.0000 | ORAL_TABLET | Freq: Three times a day (TID) | ORAL | 0 refills | Status: AC | PRN

## 2022-02-27 NOTE — Progress Notes (Signed)
?[facility] ?[facility] ?[place] ?[place] [address] ?Dept: [phone] ?Dept Fax: [phone] ? ?Office Visit ? ?Subjective:  ? ? Patient ID: Crystal Shea, female    DOB: [birth year], 51 y.o..   MRN: [id number] ? ?Chief Complaint  ?Patient presents with  ? Hospitalization Follow-up  ?  F/u from hospital 02/26/22 abdominal pain/vomiting, and blood in stool.    ? ? ?History of Present Illness: ? ?Patient is in today for follow-up from an ED visit yesterday. [name] was seen at [facility] with diffuse, sharp/stabbing abdominal pain that started about 3 am. This was followed by watery diarrhea and then bright red blood. She also had some nausea and vomiting. She ahd laboratory evaluation and a CT scan performed. She was discharged home with Zofran and Bentyl (neither of which she ended up taking). [name] had a history of past episodes of diverticulitis and colitis. She notes these were treated with antibiotics. She states her last colonoscopy was in 2016 at [facility]. She recalls that this was normal. ? ?Past Medical History: ?Patient Active Problem List  ? Diagnosis Date Noted  ? Diverticulosis 10/28/2021  ? 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 Allergic conjunctivitis of both eyes 10/28/2021  ? Prediabetes 10/28/2021  ? Irregular periods 02/26/2020  ? Menopausal symptom 02/26/2020  ? Multiple thyroid nodules 02/26/2020  ? Essential hypertension 12/14/2018  ? Overweight (BMI 25.0-29.9) 01/26/2017  ? Hoarseness 02/04/2014  ? Seasonal allergic rhinitis 02/04/2014  ? Mixed hyperlipidemia 12/11/2012  ? ?Past Surgical History:  ?Procedure Laterality Date  ? CESAREAN SECTION    ? x2  ? CHOLECYSTECTOMY, LAPAROSCOPIC    ? OPEN REDUCTION INTERNAL FIXATION (ORIF) METACARPAL Left   ? 5th  ? TUBAL LIGATION    ? ?Family History  ?Problem Relation Age of Onset  ? Heart disease Mother   ? Hypertension Mother   ? Diabetes Mother   ? Heart attack Mother 78  ? COPD Father   ?  Diabetes Sister   ? Heart disease Sister   ? Stroke Sister   ? Cancer Sister   ?     Kidney  ? Heart disease Sister   ? Heart failure Sister   ? Heart attack Sister 33  ? Cancer Sister   ?     Kidney  ? Heart disease Sister   ? Heart failure Sister   ? Seizures Sister   ? Stroke Maternal Grandmother   ? Diabetes Maternal Grandmother   ? Diabetes Maternal Grandfather   ? Heart disease Paternal Grandmother   ? ?Outpatient Medications Prior to Visit  ?Medication Sig Dispense Refill  ? atorvastatin (LIPITOR) 40 MG tablet Take 1 tablet (40 mg total) by mouth daily. 90 tablet 3  ? cetirizine (ZYRTEC) 10 MG tablet Take 10-20 mg by mouth daily as needed for allergies.    ? dicyclomine (BENTYL) 20 MG tablet Take 1 tablet (20 mg total) by mouth 2 (two) times daily. 20 tablet 0  ? ezetimibe (ZETIA) 10 MG tablet Take 1 tablet (10 mg total) by mouth daily after supper. 90 tablet 3  ? fluticasone (FLONASE) 50 MCG/ACT nasal spray Place into both nostrils daily.    ? lisinopril-hydrochlorothiazide (ZESTORETIC) 20-12.5 MG tablet Take 1 tablet by mouth daily. 90 tablet 1  ? Olopatadine HCl 0.2 % SOLN Pataday 0.2 % eye drops  PLACE ONE DROP INTO BOTH EYES DAILY. (Patient taking differently: Place 1 drop into both eyes daily as needed (allergies).) 2.5 mL 11  ?  ondansetron (ZOFRAN-ODT) 4 MG disintegrating tablet Take 1 tablet (4 mg total) by mouth every 8 (eight) hours as needed for nausea or vomiting. 20 tablet 0  ? SAXENDA 18 MG/3ML SOPN INJECT 3 MG INTO THE SKIN ONCE A WEEK. (Patient taking differently: Inject 3 mg into the skin daily.) 3 mL 2  ? Tetrahydrozoline HCl (VISINE OP) Place 1 drop into both eyes daily. For redness    ? valACYclovir (VALTREX) 500 MG tablet Take 500 mg by mouth daily.    ? VITAMIN D PO Take 2 tablets by mouth daily.    ? ?No facility-administered medications prior to visit.  ? ?No Known Allergies ?   ?Objective:  ? ?Today's Vitals  ? 02/27/22 1125  ?BP: 132/76  ?Pulse: 70  ?Temp: (!) 97.4 ?F (36.3 ?C)   ?TempSrc: Temporal  ?SpO2: 96%  ?Weight: 161 lb 12.8 oz (73.4 kg)  ?Height: '5\' 3"'$  (1.6 m)  ? ?Body mass index is 28.66 kg/m?.  ? ?General: Well developed, well nourished. No acute distress. ?Abdomen: Soft. Bowel sounds positive, normal pitch and frequency. No hepatosplenomegaly.   ? Mild diffuse tenderness, but slightly more so int he LLQ. No rebound or guarding. ?Psych: Alert and oriented. Normal mood and affect. ? ?Health Maintenance Due  ?Topic Date Due  ? HIV Screening  Never done  ? COLONOSCOPY (Pts 45-14yr Insurance coverage will need to be confirmed)  Never done  ? ?Lab Results: ? ?  Latest Ref Rng & Units 02/26/2022  ?  8:20 AM 12/10/2020  ?  9:25 AM 07/14/2019  ? 11:33 PM  ?CBC  ?WBC 4.0 - 10.5 K/uL 6.5   4.2   7.3    ?Hemoglobin 12.0 - 15.0 g/dL 15.2   13.0   13.3    ?Hematocrit 36.0 - 46.0 % 45.1   38.5   40.4    ?Platelets 150 - 400 K/uL 256   228.0   248    ? ? ?  Latest Ref Rng & Units 02/26/2022  ?  8:20 AM 10/28/2021  ? 10:50 AM 12/10/2020  ?  9:25 AM  ?CMP  ?Glucose 70 - 99 mg/dL 91   80   83    ?BUN 6 - 20 mg/dL '23   15   13    '$ ?Creatinine 0.44 - 1.00 mg/dL 0.90   0.86   0.95    ?Sodium 135 - 145 mmol/L 142   141   142    ?Potassium 3.5 - 5.1 mmol/L 3.6   3.6   3.2    ?Chloride 98 - 111 mmol/L 105   102   105    ?CO2 22 - 32 mmol/L 29   32   32    ?Calcium 8.9 - 10.3 mg/dL 9.5   9.3   9.3    ?Total Protein 6.5 - 8.1 g/dL 8.9      ?Total Bilirubin 0.3 - 1.2 mg/dL 0.7      ?Alkaline Phos 38 - 126 U/L 92      ?AST 15 - 41 U/L 22    15    ?ALT 0 - 44 U/L 20    13    ? ?Imaging: ?CT of Abdomen and Pelvis w contrast (02/26/2022) ?IMPRESSION: ?1. No acute findings in the abdomen or pelvis. Specifically, no findings to explain the patient's history of abdominal pain and bloody stools. ?2. 17 mm subcapsular lesion posterior right hepatic lobe, new in the interval. This is indeterminate but does not appear  to represent a cyst. Nonemergent follow-up outpatient MRI of the abdomen with and without contrast  recommended after patient recovers from acute ?symptoms. ?3. Uterine fibroids. ?4. Aortic Atherosclerosis (ICD10-I70.0). ?   ?Assessment & Plan:  ? ?1. Acute left lower quadrant pain ?2. Rectal bleed ?MS. Langstaff's symptoms are suggestive of an acute colitis or diverticulitis. However, the CT scan was normal and her WBC was not elevated. He exam today appears stable. I advised her that antibiotics are not always prescribed for diverticulitis. I recommend she eat a light diet and monitor this over the coming week. If her symptoms worsen, I recommend she follow-up with me. I will refer her to GI to consider a repeat colonoscopy. ? ?- Ambulatory referral to Gastroenterology ?- HYDROcodone-acetaminophen (NORCO) 10-325 MG tablet; Take 1 tablet by mouth every 8 (eight) hours as needed for up to 5 days.  Dispense: 15 tablet; Refill: 0 ? ?3. Liver mass, right lobe ?Will readdress after her current issue resolves. ? ?Return in about 4 days (around 03/03/2022), or if symptoms worsen or fail to improve.  ? ?[name], MD ?

## 2022-03-01 ENCOUNTER — Ambulatory Visit: Payer: BC Managed Care – PPO | Admitting: Gastroenterology

## 2022-03-01 ENCOUNTER — Encounter: Payer: Self-pay | Admitting: Gastroenterology

## 2022-03-01 VITALS — BP 100/54 | HR 58 | Ht 62.0 in | Wt 163.5 lb

## 2022-03-01 DIAGNOSIS — K5909 Other constipation: Secondary | ICD-10-CM | POA: Diagnosis not present

## 2022-03-01 DIAGNOSIS — R1032 Left lower quadrant pain: Secondary | ICD-10-CM

## 2022-03-01 DIAGNOSIS — K769 Liver disease, unspecified: Secondary | ICD-10-CM | POA: Diagnosis not present

## 2022-03-01 DIAGNOSIS — K625 Hemorrhage of anus and rectum: Secondary | ICD-10-CM | POA: Diagnosis not present

## 2022-03-01 MED ORDER — METOCLOPRAMIDE HCL 5 MG PO TABS: ORAL_TABLET | ORAL | 0 refills | Status: DC

## 2022-03-01 MED ORDER — GOLYTELY 236 G PO SOLR: 4000.0000 mL | Freq: Once | ORAL | 0 refills | Status: AC

## 2022-03-01 NOTE — Patient Instructions (Addendum)
If you are age 50 or older, your body mass index should be between 23-30. Your Body mass index is 29.9 kg/m?Marland Kitchen If this is out of the aforementioned range listed, please consider follow up with your Primary Care Provider. ? ?If you are age 50 or younger, your body mass index should be between 19-25. Your Body mass index is 29.9 kg/m?Marland Kitchen If this is out of the aformentioned range listed, please consider follow up with your Primary Care Provider.  ? ?________________________________________________________ ? ?The [facility] providers would like to encourage you to use [facility] to communicate with providers for non-urgent requests or questions.  Due to long hold times on the telephone, sending your provider a message by [facility] may be a faster and more efficient way to get a response.  Please allow 48 business hours for a response.  Please remember that this is for non-urgent requests.  ?_______________________________________________________ ? ?You have been scheduled for an MRI at [facility] on 03-10-2022. Your appointment time is 4pm. Please arrive to admitting (at main entrance of the hospital) 15 minutes prior to your appointment time for registration purposes. Please make certain not to have anything to eat or drink 4 hours prior to your test. In addition, if you have any metal in your body, have a pacemaker or defibrillator, please be sure to let your ordering physician know. This test typically takes 45 minutes to 1 hour to complete. Should you need to reschedule, please call [phone] to do so. ? ?You have been scheduled for a colonoscopy. Please follow written instructions given to you at your visit today.  ?Please pick up your prep supplies at the pharmacy within the next 1-3 days. ?If you use inhalers (even only as needed), please bring them with you on the day of your procedure. Please hold the Saxenda 2 days before the procedure.  ? ?Due to recent changes in healthcare laws, you may see the results  of your imaging and laboratory studies on MyChart before your provider has had a chance to review them.  We understand that in some cases there may be results that are confusing or concerning to you. Not all laboratory results come back in the same time frame and the provider may be waiting for multiple results in order to interpret others.  Please give [place] 48 hours in order for your provider to thoroughly review all the results before contacting the office for clarification of your results.  ? ?Start Miralax 1 capful a day. ? ? ?Due to recent changes in healthcare laws, you may see the results of your imaging and laboratory studies on MyChart before your provider has had a chance to review them.  We understand that in some cases there may be results that are confusing or concerning to you. Not all laboratory results come back in the same time frame and the provider may be waiting for multiple results in order to interpret others.  Please give [place] 48 hours in order for your provider to thoroughly review all the results before contacting the office for clarification of your results.   ? ?It was a pleasure to see you today! ? ?Thank you for trusting me with your gastrointestinal care!   ? ? ? ? ?

## 2022-03-01 NOTE — Progress Notes (Signed)
? ? ?[facility] Consult Note: ? ?History: ?Crystal Shea ?03/01/2022 ? ?Referring provider: [name], MD ? ?Reason for consult/chief complaint: Diverticulitis, Rectal Bleeding (BRB on the toilet paper and in the toilet last seen Monday), Vomiting (Had recent ED visit), Diarrhea (Had recent ED visit), and Constipation (Having some constipation today) ? ? ?Subjective  ?HPI: ?From primary care note dated 02/27/2022, the day after patient's ED visit: ?"Patient is in today for follow-up from an ED visit yesterday. Crystal Shea was seen at [facility] with diffuse, sharp/stabbing abdominal pain that started about 3 am. This was followed by watery diarrhea and then bright red blood. She also had some nausea and vomiting. She ahd laboratory evaluation and a CT scan performed. She was discharged home with Zofran and Bentyl (neither of which she ended up taking). Crystal Shea had a history of past episodes of diverticulitis and colitis. She notes these were treated with antibiotics. She states her last colonoscopy was in 2016 at [facility]. She recalls that this was normal. " ? ?ED provider note reviewed and data recorded as below. ? ?Crystal Shea reports chronic constipation for many years, with a BM about twice a week on average.  She had ED visits in 2016, 2020 and earlier this week, all of which she says behaved in an identical fashion with acute onset lower abdominal pain, vomiting and diarrhea followed by bleeding.  After the first episode she had a colonoscopy at [facility] (no report today). ?She seems to recall getting antibiotics for those first 2 episodes, but none on the recent ED visit because the CT scan did not show definite diverticulitis.  Symptoms have significantly improved, she now has some mild residual LLQ pain and is back to her baseline constipation.  Bleeding lasted about a day and a half. ? ? ?ROS: ? ?Review of Systems  ?Constitutional:  Negative for appetite change and unexpected  weight change.  ?HENT:  Negative for mouth sores and voice change.   ?Eyes:  Negative for pain and redness.  ?Respiratory:  Negative for cough and shortness of breath.   ?Cardiovascular:  Negative for chest pain and palpitations.  ?Genitourinary:  Negative for dysuria and hematuria.  ?Musculoskeletal:  Negative for arthralgias and myalgias.  ?Skin:  Negative for pallor and rash.  ?Neurological:  Negative for weakness and headaches.  ?Hematological:  Negative for adenopathy.  ? ? ?Past Medical History: ?Past Medical History:  ?Diagnosis Date  ? Allergic   ? CHF (congestive heart failure) ([place])   ? Diverticulitis   ? Essential hypertension 12/14/2018  ? Gallstones   ? Hyperlipidemia   ? ? ? ?Past Surgical History: ?Past Surgical History:  ?Procedure Laterality Date  ? CESAREAN SECTION    ? x2  ? CHOLECYSTECTOMY, LAPAROSCOPIC    ? OPEN REDUCTION INTERNAL FIXATION (ORIF) METACARPAL Left   ? 5th  ? TUBAL LIGATION    ? ? ? ?Family History: ?Family History  ?Problem Relation Age of Onset  ? Heart disease Mother   ? Hypertension Mother   ? Diabetes Mother   ? Heart attack Mother 9  ? COPD Father   ? Diabetes Father   ? Diabetes Sister   ? Heart disease Sister   ? Stroke Sister   ? Kidney cancer Sister   ? Heart disease Sister   ? Heart failure Sister   ? Heart attack Sister 50  ? Kidney cancer Sister   ? Heart disease Sister   ? Heart failure Sister   ?  Seizures Sister   ? Stroke Maternal Grandmother   ? Diabetes Maternal Grandmother   ? Diabetes Maternal Grandfather   ? Heart disease Paternal Grandmother   ? Seizures Daughter   ? Asthma Son   ? ? ?Social History: ?Social History  ? ?Socioeconomic History  ? Marital status: Significant Other  ?  Spouse name: Not on file  ? Number of children: 4  ? Years of education: Not on file  ? Highest education level: Not on file  ?Occupational History  ? Occupation: Naval architect  ?  Comment: [employer] [place]  ?Tobacco Use  ? Smoking status:  Never  ? Smokeless tobacco: Never  ?Vaping Use  ? Vaping Use: Never used  ?Substance and Sexual Activity  ? Alcohol use: Yes  ?  Comment: occasional  ? Drug use: No  ? Sexual activity: Yes  ?Other Topics Concern  ? Not on file  ?Social History Narrative  ? Not on file  ? ?Social Determinants of Health  ? ?Financial Resource Strain: Not on file  ?Food Insecurity: Not on file  ?Transportation Needs: Not on file  ?Physical Activity: Not on file  ?Stress: Not on file  ?Social Connections: Not on file  ? ? ?Allergies: ?No Known Allergies ? ?Outpatient Meds: ?Current Outpatient Medications  ?Medication Sig Dispense Refill  ? atorvastatin (LIPITOR) 40 MG tablet Take 1 tablet (40 mg total) by mouth daily. 90 tablet 3  ? cetirizine (ZYRTEC) 10 MG tablet Take 10-20 mg by mouth daily as needed for allergies.    ? dicyclomine (BENTYL) 20 MG tablet Take 1 tablet (20 mg total) by mouth 2 (two) times daily. 20 tablet 0  ? ezetimibe (ZETIA) 10 MG tablet Take 1 tablet (10 mg total) by mouth daily after supper. 90 tablet 3  ? fluticasone (FLONASE) 50 MCG/ACT nasal spray Place into both nostrils daily.    ? HYDROcodone-acetaminophen (NORCO) 10-325 MG tablet Take 1 tablet by mouth every 8 (eight) hours as needed for up to 5 days. 15 tablet 0  ? lisinopril-hydrochlorothiazide (ZESTORETIC) 20-12.5 MG tablet Take 1 tablet by mouth daily. 90 tablet 1  ? metoCLOPramide (REGLAN) 5 MG tablet Take 1 tablet 30 minutes before drinking bowel prep 2 tablet 0  ? Olopatadine HCl 0.2 % SOLN Pataday 0.2 % eye drops  PLACE ONE DROP INTO BOTH EYES DAILY. (Patient taking differently: Place 1 drop into both eyes daily as needed (allergies).) 2.5 mL 11  ? ondansetron (ZOFRAN-ODT) 4 MG disintegrating tablet Take 1 tablet (4 mg total) by mouth every 8 (eight) hours as needed for nausea or vomiting. 20 tablet 0  ? polyethylene glycol (GOLYTELY) 236 g solution Take 4,000 mLs by mouth once for 1 dose. 4000 mL 0  ? SAXENDA 18 MG/3ML SOPN INJECT 3 MG INTO THE  SKIN ONCE A WEEK. (Patient taking differently: Inject 3 mg into the skin daily.) 3 mL 2  ? Tetrahydrozoline HCl (VISINE OP) Place 1 drop into both eyes daily. For redness    ? valACYclovir (VALTREX) 500 MG tablet Take 500 mg by mouth as needed.    ? VITAMIN D PO Take 2 tablets by mouth daily.    ? ?No current facility-administered medications for this visit.  ? ? ? ? ?___________________________________________________________________ ?Objective  ? ?Exam: ? ?BP (!) 100/54   Pulse (!) 58   Ht '5\' 2"'$  (1.575 m) Comment: height measured without shoes  Wt 163 lb 8 oz (74.2 kg)   LMP 02/03/2015 (  Approximate)   BMI 29.90 kg/m?  ?Wt Readings from Last 3 Encounters:  ?03/01/22 163 lb 8 oz (74.2 kg)  ?02/27/22 161 lb 12.8 oz (73.4 kg)  ?01/26/22 166 lb 9.6 oz (75.6 kg)  ? ? ?General: Well-appearing ?Eyes: sclera anicteric, no redness ?ENT: oral mucosa moist without lesions, no cervical or supraclavicular lymphadenopathy ?CV: RRR without murmur, S1/S2, no JVD, no peripheral edema ?Resp: clear to auscultation bilaterally, normal RR and effort noted ?GI: soft, mild LLQ tenderness, with active bowel sounds. No guarding or palpable organomegaly noted. ?Skin; warm and dry, no rash or jaundice noted ?Neuro: awake, alert and oriented x 3. Normal gross motor function and fluent speech ? ?Labs: ? ? ?  Latest Ref Rng & Units 02/26/2022  ?  8:20 AM 12/10/2020  ?  9:25 AM 07/14/2019  ? 11:33 PM  ?CBC  ?WBC 4.0 - 10.5 K/uL 6.5   4.2   7.3    ?Hemoglobin 12.0 - 15.0 g/dL 15.2   13.0   13.3    ?Hematocrit 36.0 - 46.0 % 45.1   38.5   40.4    ?Platelets 150 - 400 K/uL 256   228.0   248    ? ? ?  Latest Ref Rng & Units 02/26/2022  ?  8:20 AM 10/28/2021  ? 10:50 AM 12/10/2020  ?  9:25 AM  ?CMP  ?Glucose 70 - 99 mg/dL 91   80   83    ?BUN 6 - 20 mg/dL '23   15   13    '$ ?Creatinine 0.44 - 1.00 mg/dL 0.90   0.86   0.95    ?Sodium 135 - 145 mmol/L 142   141   142    ?Potassium 3.5 - 5.1 mmol/L 3.6   3.6   3.2    ?Chloride 98 - 111 mmol/L 105   102   105     ?CO2 22 - 32 mmol/L 29   32   32    ?Calcium 8.9 - 10.3 mg/dL 9.5   9.3   9.3    ?Total Protein 6.5 - 8.1 g/dL 8.9      ?Total Bilirubin 0.3 - 1.2 mg/dL 0.7      ?Alkaline Phos 38 - 126 U/L 92      ?AST 15 - 41 U/

## 2022-03-08 ENCOUNTER — Ambulatory Visit: Payer: BC Managed Care – PPO | Admitting: Podiatry

## 2022-03-10 ENCOUNTER — Ambulatory Visit (HOSPITAL_COMMUNITY)
Admission: RE | Admit: 2022-03-10 | Discharge: 2022-03-10 | Disposition: A | Discharge status: Home / Self Care | Payer: BC Managed Care – PPO | Source: Ambulatory Visit | Attending: Gastroenterology | Admitting: Gastroenterology

## 2022-03-10 DIAGNOSIS — K625 Hemorrhage of anus and rectum: Secondary | ICD-10-CM | POA: Diagnosis present

## 2022-03-10 DIAGNOSIS — R1032 Left lower quadrant pain: Secondary | ICD-10-CM | POA: Insufficient documentation

## 2022-03-10 DIAGNOSIS — K5909 Other constipation: Secondary | ICD-10-CM | POA: Insufficient documentation

## 2022-03-10 DIAGNOSIS — K769 Liver disease, unspecified: Secondary | ICD-10-CM | POA: Insufficient documentation

## 2022-03-10 MED ORDER — GADOBUTROL 1 MMOL/ML IV SOLN
8.0000 mL | Freq: Once | INTRAVENOUS | Status: AC | PRN
  Administered 2022-03-10: 8 mL via INTRAVENOUS

## 2022-03-16 ENCOUNTER — Ambulatory Visit (AMBULATORY_SURGERY_CENTER): Payer: BC Managed Care – PPO | Admitting: Gastroenterology

## 2022-03-16 ENCOUNTER — Encounter: Payer: Self-pay | Admitting: Gastroenterology

## 2022-03-16 VITALS — BP 108/52 | HR 73 | Temp 97.8°F | Resp 17 | Ht 62.0 in | Wt 163.0 lb

## 2022-03-16 DIAGNOSIS — R1032 Left lower quadrant pain: Secondary | ICD-10-CM | POA: Diagnosis present

## 2022-03-16 DIAGNOSIS — K5909 Other constipation: Secondary | ICD-10-CM | POA: Diagnosis not present

## 2022-03-16 DIAGNOSIS — K625 Hemorrhage of anus and rectum: Secondary | ICD-10-CM

## 2022-03-16 MED ORDER — SODIUM CHLORIDE 0.9 % IV SOLN: 500.0000 mL | Freq: Once | INTRAVENOUS | Status: DC

## 2022-03-16 NOTE — Progress Notes (Signed)
No changes to clinical history since GI office visit on 03/01/22.  The patient is appropriate for an endoscopic procedure in the ambulatory setting.  - Soyars Danis, MD    

## 2022-03-16 NOTE — Op Note (Signed)
[facility] Patient Name: Crystal Shea Procedure Date: 03/16/2022 3:11 PM MRN: [id number] Endoscopist: Mallie Mussel L. Danis , MD Age: 51 Referring MD:  Date of Birth: [birth year] Gender: Female Account #: [id number] Procedure:                Colonoscopy Indications:              Abdominal pain in the left lower quadrant,                            Hematochezia, Constipation                           years of constipation, episodes of acute onset LLQ                            pain and hematochezia in 2016, 2020 suspected to be                            diverticulitis and treated as such. Recent episode                            with similar symptoms, no evidence colon                            inflammation or diverticulitis seen on CTAP,                            resolved without Abx. (recent office consult note                            with clinical details) Medicines:                Monitored Anesthesia Care Procedure:                Pre-Anesthesia Assessment:                           - Prior to the procedure, a History and Physical                            was performed, and patient medications and                            allergies were reviewed. The patient's tolerance of                            previous anesthesia was also reviewed. The risks                            and benefits of the procedure and the sedation                            options and risks were discussed with the patient.  All questions were answered, and informed consent                            was obtained. Prior Anticoagulants: The patient has                            taken no previous anticoagulant or antiplatelet                            agents. ASA Grade Assessment: II - A patient with                            mild systemic disease. After reviewing the risks                            and benefits, the patient was deemed in                             satisfactory condition to undergo the procedure.                           After obtaining informed consent, the colonoscope                            was passed under direct vision. Throughout the                            procedure, the patient's blood pressure, pulse, and                            oxygen saturations were monitored continuously. The                            Olympus [id number]-[id number]) Colonoscope was                            introduced through the anus and advanced to the the                            cecum, identified by appendiceal orifice and                            ileocecal valve. The colonoscopy was performed                            without difficulty. The patient tolerated the                            procedure well. The quality of the bowel                            preparation was excellent. The ileocecal valve,  appendiceal orifice, and rectum were photographed. Scope In: 3:35:08 PM Scope Out: 3:47:45 PM Scope Withdrawal Time: 0 hours 7 minutes 12 seconds  Total Procedure Duration: 0 hours 12 minutes 37 seconds  Findings:                 The perianal and digital rectal examinations were                            normal.                           There is no endoscopic evidence of diverticula or                            polyps in the entire colon.                           Retroflexion in the rectum was not performed due to                            anatomy.                           Normal mucosa was found in the entire colon. Complications:            No immediate complications. Estimated Blood Loss:     Estimated blood loss: none. Impression:               - Normal mucosa in the entire examined colon.                           - No specimens collected.                           "a few" sigmoid diverticul described on the 2016                            CTAP. Not seen today, though perhaps small and deep                             between haustral folds.                           I suspect these episodes have been ischemic colitis                            precipitated by constipation. Recommendation:           - Patient has a contact number available for                            emergencies. The signs and symptoms of potential                            delayed complications were discussed with the  patient. Return to normal activities tomorrow.                            Written discharge instructions were provided to the                            patient.                           - Resume previous diet.                           - Continue present medications.                           - Repeat colonoscopy in 10 years for screening                            purposes.                           - If daily miralax has not helped to maintain                            regularity, contact my office and we will try                            [place]. [name], MD 03/16/2022 3:58:11 PM This report has been signed electronically.

## 2022-03-16 NOTE — Patient Instructions (Signed)
Repeat Colonoscopy in 10 yrs for screening purposes    If daily Miralax has not helped to maintain regularity,contact Dr Loletha Carrow' office and we will try [place]:   Refer to the procedure report that was given to you for any specific questions about what was found during the examination.  If the procedure report does not answer your questions, please call your gastroenterologist to clarify.  If you requested that your care partner not be given the details of your procedure findings, then the procedure report has been included in a sealed envelope for you to review at your convenience later.  YOU SHOULD EXPECT: Some feelings of bloating in the abdomen. Passage of more gas than usual.  Walking can help get rid of the air that was put into your GI tract during the procedure and reduce the bloating. If you had a lower endoscopy (such as a colonoscopy or flexible sigmoidoscopy) you may notice spotting of blood in your stool or on the toilet paper. If you underwent a bowel prep for your procedure, you may not have a normal bowel movement for a few days.  Please Note:  You might notice some irritation and congestion in your nose or some drainage.  This is from the oxygen used during your procedure.  There is no need for concern and it should clear up in a day or so.  SYMPTOMS TO REPORT IMMEDIATELY:  Following lower endoscopy (colonoscopy or flexible sigmoidoscopy):  Excessive amounts of blood in the stool  Significant tenderness or worsening of abdominal pains  Swelling of the abdomen that is new, acute  Fever of 100F or higher   For urgent or emergent issues, a gastroenterologist can be reached at any hour by calling [phone]. Do not use MyChart messaging for urgent concerns.    DIET:  We do recommend a small meal at first, but then you may proceed to your regular diet.  Drink plenty of fluids but you should avoid  alcoholic beverages for 24 hours.  ACTIVITY:  You should plan to take it easy for the rest of today and you should NOT DRIVE or use heavy machinery until tomorrow (because of the sedation medicines used during the test).    FOLLOW UP: Our staff will call the number listed on your records 48-72 hours following your procedure to check on you and address any questions or concerns that you may have regarding the information given to you following your procedure. If we do not reach you, we will leave a message.  We will attempt to reach you two times.  During this call, we will ask if you have developed any symptoms of COVID 19. If you develop any symptoms (ie: fever, flu-like symptoms, shortness of breath, cough etc.) before then, please call [phone].  If you test positive for Covid 19 in the 2 weeks post procedure, please call and report this information to [place].    If any biopsies were taken you will be contacted by phone or by letter within the next 1-3 weeks.  Please call us at [phone] if you have not heard about the biopsies in 3 weeks.    SIGNATURES/CONFIDENTIALITY: You and/or your care partner have signed paperwork which will be entered into your electronic medical record.  These signatures attest to the fact that that the information above on your After Visit Summary has been reviewed and is understood.  Full responsibility of the confidentiality of this discharge information lies with you and/or your care-partner.  

## 2022-03-16 NOTE — Progress Notes (Signed)
VS completed by DT.  Pt's states no medical or surgical changes since previsit or office visit.  

## 2022-03-16 NOTE — Progress Notes (Signed)
A and O x3. Report to RN. Tolerated MAC anesthesia well. 

## 2022-03-17 ENCOUNTER — Telehealth: Payer: Self-pay

## 2022-03-17 ENCOUNTER — Telehealth: Payer: Self-pay | Admitting: *Deleted

## 2022-03-17 NOTE — Telephone Encounter (Signed)
First follow up call attempt. Voicemail unable to accept any messages.

## 2022-03-17 NOTE — Telephone Encounter (Signed)
Attempted to reach pt. With follow-up call following endoscopic procedure 03/16/2022.  No answer, unable to LM.

## 2022-03-21 ENCOUNTER — Other Ambulatory Visit: Payer: Self-pay | Admitting: Family Medicine

## 2022-03-21 DIAGNOSIS — E669 Obesity, unspecified: Secondary | ICD-10-CM

## 2022-04-27 ENCOUNTER — Ambulatory Visit: Payer: BC Managed Care – PPO | Admitting: Family Medicine

## 2022-04-27 ENCOUNTER — Encounter: Payer: Self-pay | Admitting: Family Medicine

## 2022-04-27 VITALS — BP 118/72 | HR 59 | Temp 97.4°F | Ht 62.0 in | Wt 169.0 lb

## 2022-04-27 DIAGNOSIS — E669 Obesity, unspecified: Secondary | ICD-10-CM | POA: Diagnosis not present

## 2022-04-27 DIAGNOSIS — K5900 Constipation, unspecified: Secondary | ICD-10-CM | POA: Diagnosis not present

## 2022-04-27 DIAGNOSIS — I10 Essential (primary) hypertension: Secondary | ICD-10-CM | POA: Diagnosis not present

## 2022-04-27 DIAGNOSIS — E7800 Pure hypercholesterolemia, unspecified: Secondary | ICD-10-CM

## 2022-04-27 DIAGNOSIS — K7689 Other specified diseases of liver: Secondary | ICD-10-CM | POA: Insufficient documentation

## 2022-04-27 DIAGNOSIS — E782 Mixed hyperlipidemia: Secondary | ICD-10-CM

## 2022-04-27 LAB — LIPID PANEL
Cholesterol: 184 mg/dL (ref 0–200)
HDL: 63.5 mg/dL (ref >39.00)
LDL Cholesterol: 112 mg/dL — ABNORMAL HIGH (ref 0–99)
NonHDL: 120.7
Total CHOL/HDL Ratio: 3
Triglycerides: 42 mg/dL (ref 0.0–149.0)
VLDL: 8.4 mg/dL (ref 0.0–40.0)

## 2022-04-27 NOTE — Progress Notes (Signed)
[facility] [address] [place] [address] Dept: [phone] Dept Fax: [phone]  Chronic Care Office Visit  Subjective:    Patient ID: Crystal Shea, female    DOB: 11-02-1970, 51 y.o..   MRN: [id number]  Chief Complaint  Patient presents with   Follow-up    3 month f/u., no concerns.  Fasting today.      History of Present Illness:  Patient is in today for reassessment of chronic medical issues.  Ms. Vandenbosch has a history of obesity. Due to a strong family history of diabetes, stroke, and heart disease, she has been working at weight loss. She had previously been treated with phentermine for her weight, but failed to have significant loss. She was started initially on semaglutide [facility]) and had a good response, though she did have some associated nausea and memory issues. Due to an insurance issue, she was then transitioned to liraglutide. She is concerned that her weight loss has plateaued. She is continuing to exercise.   Ms. Depaoli has a history of hyperlipidemia. She is managed on atorvastatin 40 mg daily.   Ms. Loveland has a history of hypertension. She is managed on Zestoretic 20-12.5 mg daily.   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   Allergic conjunctivitis of both eyes 10/28/2021   Prediabetes 10/28/2021   Irregular periods 02/26/2020   Menopausal symptom 02/26/2020   Multiple thyroid nodules 02/26/2020   Essential hypertension 12/14/2018   Class 1 obesity 01/26/2017   Hoarseness 02/04/2014   Seasonal allergic rhinitis 02/04/2014   Mixed hyperlipidemia 12/11/2012   Past Surgical History:  Procedure Laterality Date   CESAREAN SECTION     x2   CHOLECYSTECTOMY, LAPAROSCOPIC      COLONOSCOPY     OPEN REDUCTION INTERNAL FIXATION (ORIF) METACARPAL Left    5th   TUBAL LIGATION     Family History  Problem Relation Age of Onset   Heart disease Mother    Hypertension Mother    Diabetes Mother    Heart attack Mother 24   COPD Father    Diabetes Father    Diabetes Sister    Heart disease Sister    Stroke Sister    Kidney cancer Sister    Heart disease Sister    Heart failure Sister    Heart attack Sister 13   Kidney cancer Sister    Heart disease Sister    Heart failure Sister    Seizures Sister    Stroke Maternal Grandmother    Diabetes Maternal Grandmother    Diabetes Maternal Grandfather    Heart disease Paternal Grandmother    Seizures Daughter    Asthma Son    Colon cancer Neg Hx    Stomach cancer Neg Hx    Rectal cancer Neg Hx    Outpatient Medications Prior to Visit  Medication Sig Dispense Refill   atorvastatin (LIPITOR) 40 MG tablet Take 1 tablet (40 mg total) by mouth daily. 90 tablet 3   cetirizine (ZYRTEC) 10 MG tablet Take 10-20 mg by mouth daily as needed for allergies.     ezetimibe (ZETIA) 10 MG tablet Take 1 tablet (10 mg total) by mouth daily after supper. 90 tablet 3   fluticasone (FLONASE) 50 MCG/ACT nasal spray Place into both  nostrils daily.     lisinopril-hydrochlorothiazide (ZESTORETIC) 20-12.5 MG tablet Take 1 tablet by mouth daily. 90 tablet 1   Olopatadine HCl 0.2 % SOLN Pataday 0.2 % eye drops  PLACE ONE DROP INTO BOTH EYES DAILY. (Patient taking differently: Place 1 drop into both eyes daily as needed (allergies).) 2.5 mL 11   ondansetron (ZOFRAN-ODT) 4 MG disintegrating tablet Take 1 tablet (4 mg total) by mouth every 8 (eight) hours as needed for nausea or vomiting. 20 tablet 0   SAXENDA 18 MG/3ML SOPN INJECT 3 MG INTO THE SKIN ONCE A WEEK. 3 mL 1   Tetrahydrozoline HCl (VISINE OP) Place 1 drop into both eyes daily. For redness     valACYclovir (VALTREX) 500 MG tablet Take 500 mg by mouth as needed.     VITAMIN D PO Take 2  tablets by mouth daily.     dicyclomine (BENTYL) 20 MG tablet Take 1 tablet (20 mg total) by mouth 2 (two) times daily. 20 tablet 0   metoCLOPramide (REGLAN) 5 MG tablet Take 1 tablet 30 minutes before drinking bowel prep 2 tablet 0   No facility-administered medications prior to visit.   No Known Allergies    Objective:   Today's Vitals   04/27/22 1008  BP: 118/72  Pulse: (!) 59  Temp: (!) 97.4 F (36.3 C)  TempSrc: Temporal  SpO2: 99%  Weight: 169 lb (76.7 kg)  Height: '5\' 2"'$  (1.575 m)   Body mass index is 30.91 kg/m.   General: Well developed, well nourished. No acute distress. Psych: Alert and oriented. Normal mood and affect.  Health Maintenance Due  Topic Date Due   HIV Screening  Never done   Imaging: MR Liver w wo contrast (03/10/2022) IMPRESSION: 1. Benign subcapsular hepatic cyst corresponds with the lesion seen on prior CT. 2. 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    Assessment & Plan:   1. Class 1 obesity Ms. Supak has lost ~ 14% of her weight since she started her weight loss efforts. We discussed that this is the reported average weight loss with the use of a GLP-1 RA. It is not likely that she will experience significant ongoing weight loss with medication. I recommended she consider stopping the liraglutide and continue her diet and exercise efforts. I will check on her again in 2 months.  2. Mixed hyperlipidemia Due for repeat lipids. Continue atorvastatin 40 mg daiy.  - Lipid panel  3. Essential hypertension Blood pressure is at goal. Continue Zestoretic 20-12.5 mg daily.  4. Constipation, unspecified constipation type Ms. [place] had a bout of abdominal pain in May. She was evaluated by GI, including having a CT scan and a colonoscopy. 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 She has added in Metamucil and this is working well. She has titrated to taking this every 2-3 days. An incidental liver mass was noted, so she had a follow-up scan which showed a likely benign hemangioma and a simple cyst.  Return in about 2 months (around 06/28/2022) for Reassessment.   [name], MD

## 2022-04-28 MED ORDER — EZETIMIBE 10 MG PO TABS
10.0000 mg | ORAL_TABLET | Freq: Every day | ORAL | 3 refills | Status: DC
Start: 2022-04-28 — End: 2023-05-30

## 2022-07-05 LAB — HM MAMMOGRAPHY

## 2022-07-05 LAB — HM PAP SMEAR

## 2022-07-06 LAB — LAB REPORT - SCANNED: Chlamydia, Swab/Urine, PCR: NEGATIVE

## 2022-07-06 LAB — HM HEPATITIS C SCREENING LAB: HM Hepatitis Screen: NEGATIVE

## 2022-07-20 ENCOUNTER — Telehealth: Payer: Self-pay

## 2022-07-20 NOTE — Telephone Encounter (Signed)
PA for Saxenda submitted through cover my meds. To [employer].   Awaiting response.  Dm/cma   Key: [id number]

## 2022-09-08 ENCOUNTER — Other Ambulatory Visit: Payer: Self-pay | Admitting: Gastroenterology

## 2022-09-08 DIAGNOSIS — I10 Essential (primary) hypertension: Secondary | ICD-10-CM

## 2022-10-16 IMAGING — DX DG CHEST 2V
2 series · 2 of 2 positions shown · non-contrast
Comparison: 11/06/2018

CLINICAL DATA: Dry cough worse when lying down for 1 week, chest
discomfort and congestion, pressure and pain with deep inspiration

EXAM:
CHEST - 2 VIEW

[chest pa]
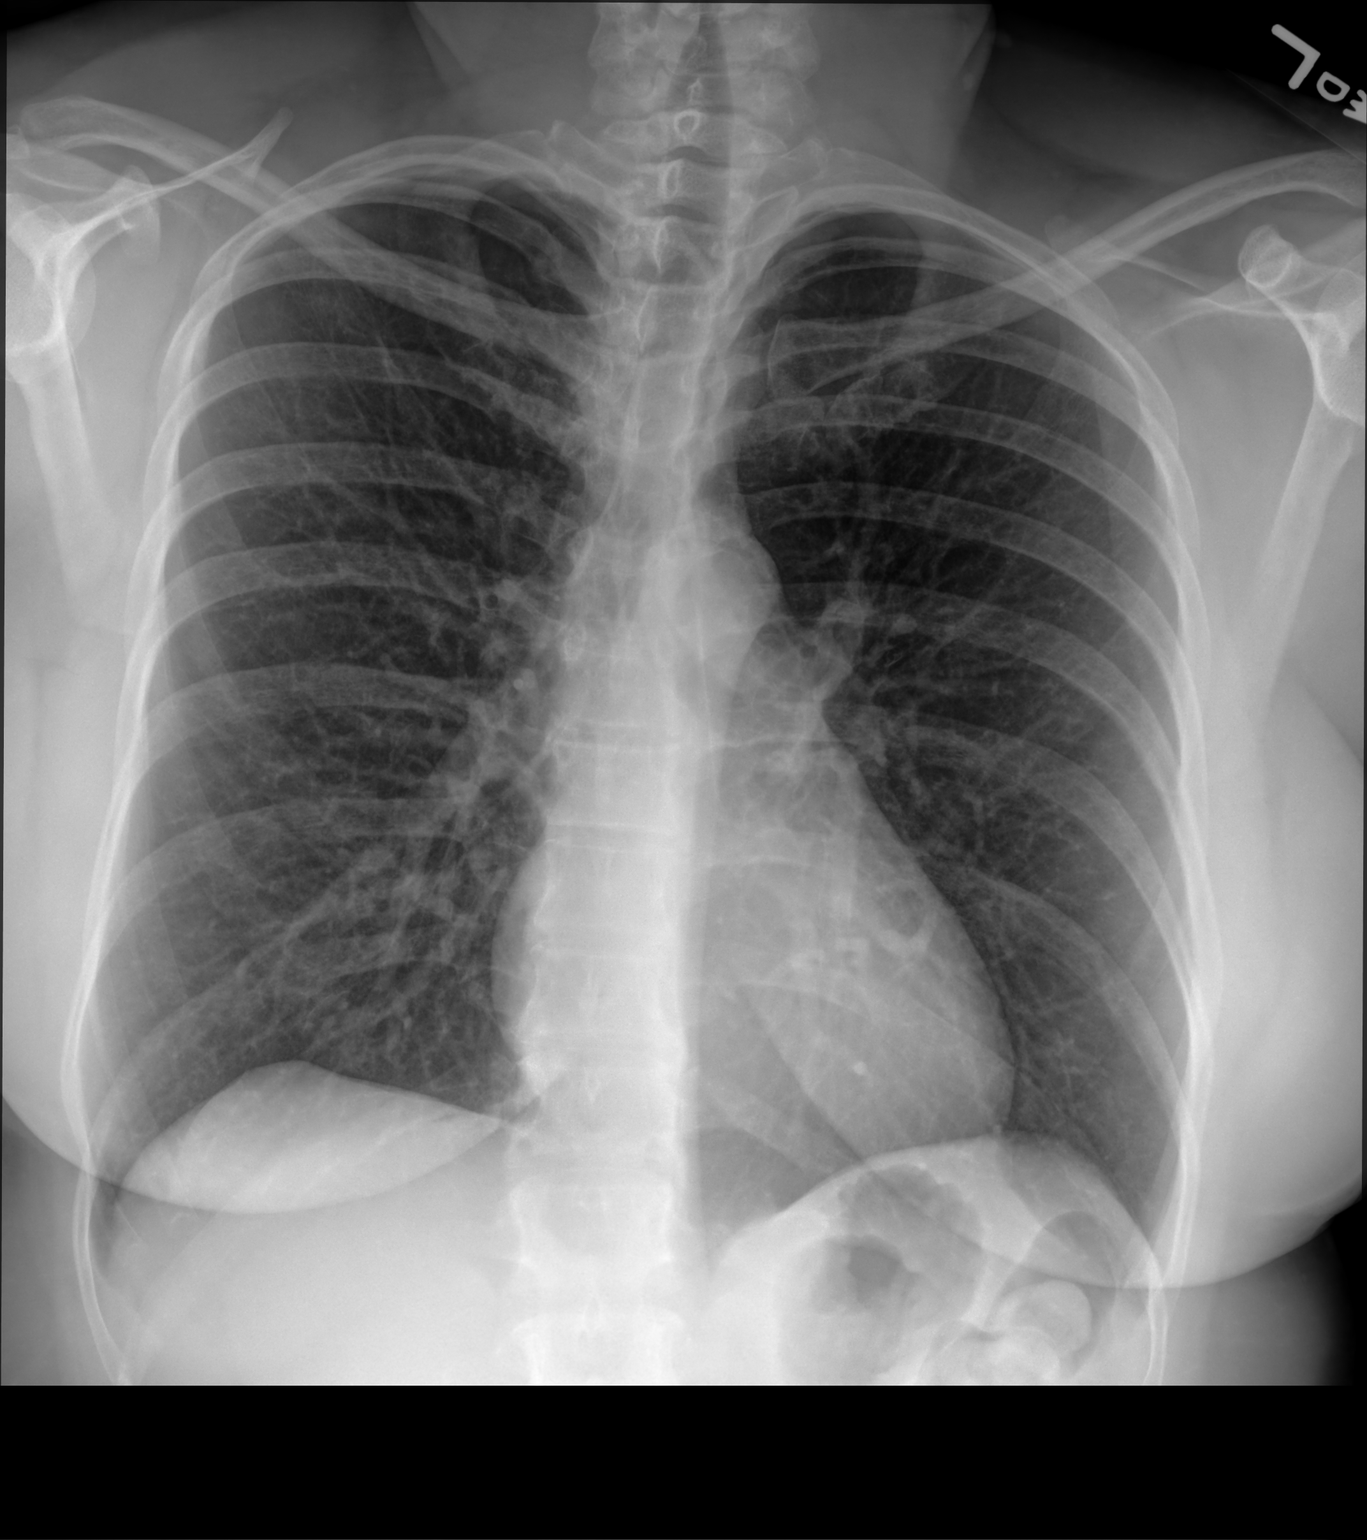

[chest lat]
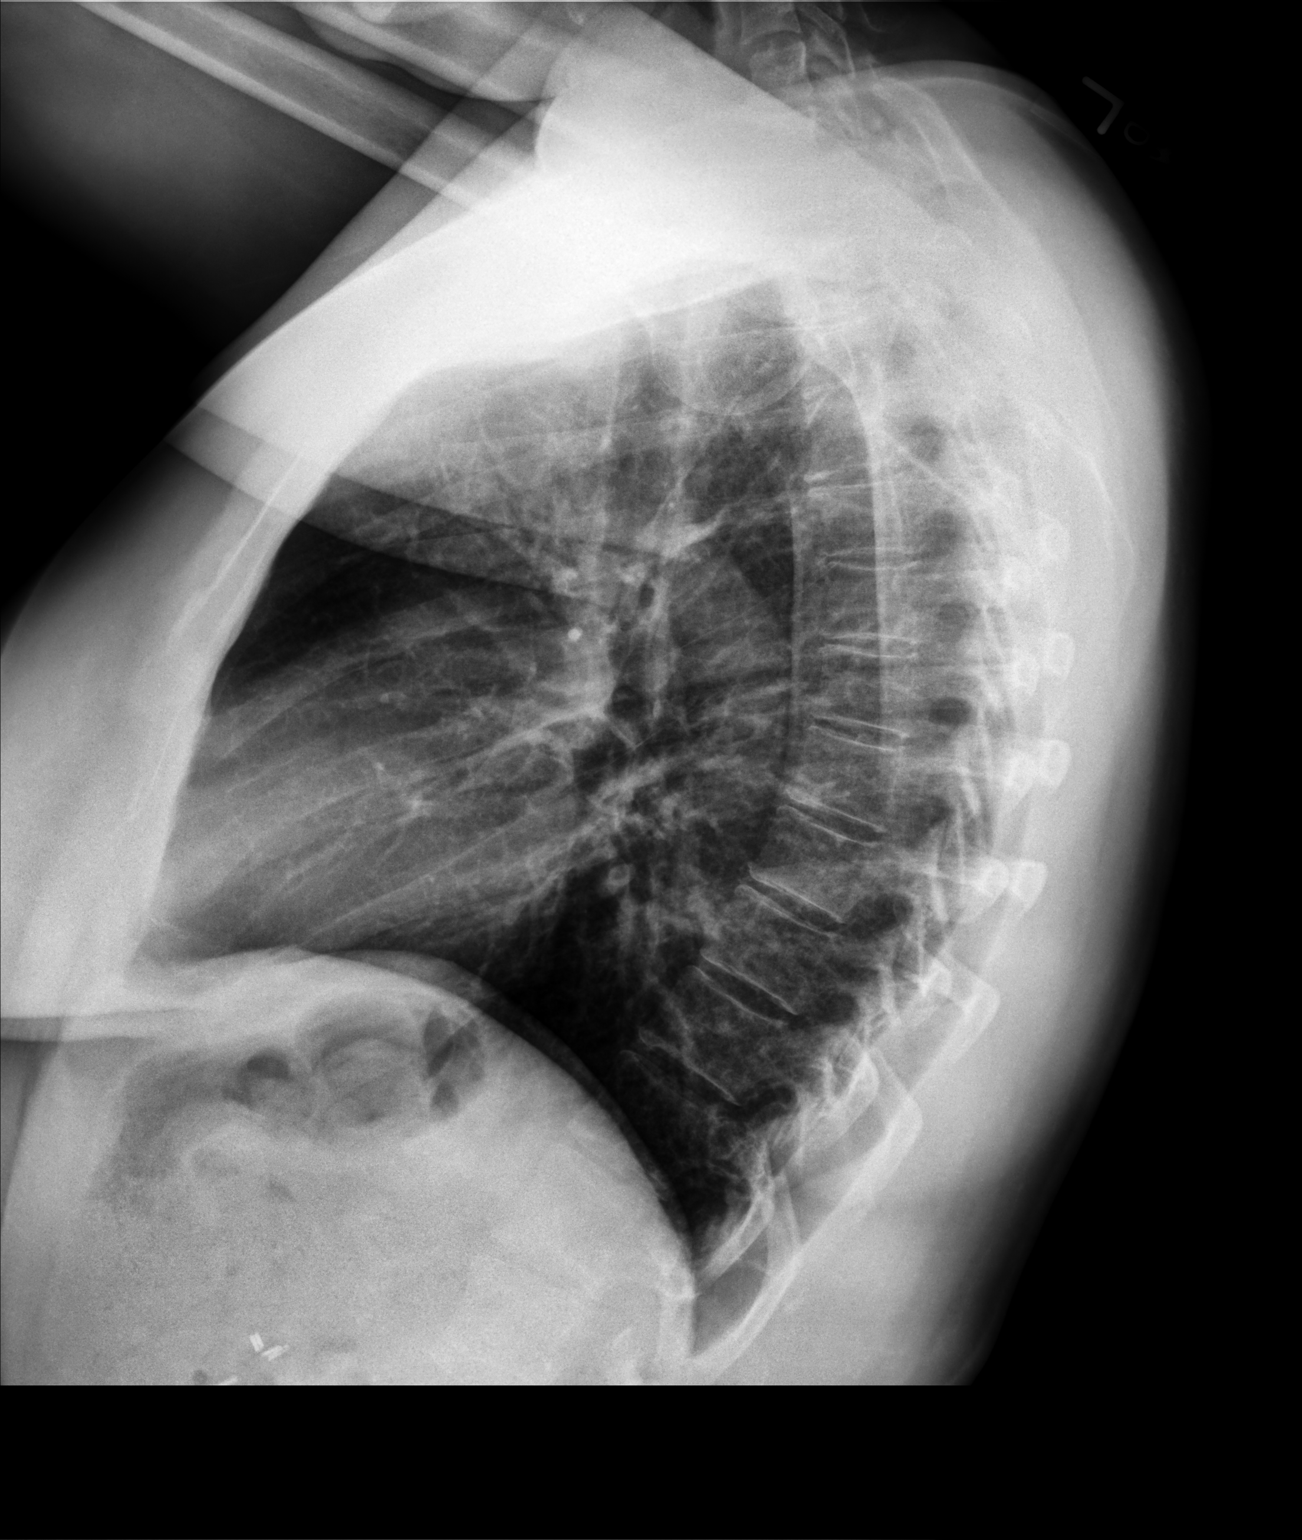

[2 of 2 positions shown; findings below may reference images not displayed]

FINDINGS: Normal heart size, mediastinal contours, and pulmonary vascularity.

Lungs clear.

No pulmonary infiltrate, pleural effusion or pneumothorax.

Osseous structures unremarkable.
IMPRESSION: No acute abnormalities.

## 2022-10-25 ENCOUNTER — Encounter: Payer: Self-pay | Admitting: Family Medicine

## 2022-10-25 ENCOUNTER — Ambulatory Visit (INDEPENDENT_AMBULATORY_CARE_PROVIDER_SITE_OTHER): Payer: BC Managed Care – PPO | Admitting: Family Medicine

## 2022-10-25 VITALS — BP 122/78 | HR 64 | Temp 98.1°F | Ht 63.0 in | Wt 190.0 lb

## 2022-10-25 DIAGNOSIS — E669 Obesity, unspecified: Secondary | ICD-10-CM

## 2022-10-25 DIAGNOSIS — R109 Unspecified abdominal pain: Secondary | ICD-10-CM

## 2022-10-25 DIAGNOSIS — R350 Frequency of micturition: Secondary | ICD-10-CM

## 2022-10-25 LAB — CBC WITH DIFFERENTIAL/PLATELET
Basophils Absolute: 0 10*3/uL (ref 0.0–0.1)
Basophils Relative: 0.5 % (ref 0.0–3.0)
Eosinophils Absolute: 0.1 10*3/uL (ref 0.0–0.7)
Eosinophils Relative: 3.3 % (ref 0.0–5.0)
HCT: 41.6 % (ref 36.0–46.0)
Hemoglobin: 14 g/dL (ref 12.0–15.0)
Lymphocytes Relative: 46.7 % — ABNORMAL HIGH (ref 12.0–46.0)
Lymphs Abs: 1.8 10*3/uL (ref 0.7–4.0)
MCHC: 33.6 g/dL (ref 30.0–36.0)
MCV: 88.1 fl (ref 78.0–100.0)
Monocytes Absolute: 0.3 10*3/uL (ref 0.1–1.0)
Monocytes Relative: 9.1 % (ref 3.0–12.0)
Neutro Abs: 1.5 10*3/uL (ref 1.4–7.7)
Neutrophils Relative %: 40.4 % — ABNORMAL LOW (ref 43.0–77.0)
Platelets: 231 10*3/uL (ref 150.0–400.0)
RBC: 4.72 Mil/uL (ref 3.87–5.11)
RDW: 14.4 % (ref 11.5–15.5)
WBC: 3.8 10*3/uL — ABNORMAL LOW (ref 4.0–10.5)

## 2022-10-25 LAB — URINALYSIS, ROUTINE W REFLEX MICROSCOPIC
Bilirubin Urine: NEGATIVE
Ketones, ur: NEGATIVE
Leukocytes,Ua: NEGATIVE
Nitrite: NEGATIVE
Specific Gravity, Urine: 1.015 (ref 1.000–1.030)
Total Protein, Urine: NEGATIVE
Urine Glucose: NEGATIVE
Urobilinogen, UA: 0.2 (ref 0.0–1.0)
pH: 5.5 (ref 5.0–8.0)

## 2022-10-25 LAB — POCT URINALYSIS DIPSTICK
Bilirubin, UA: NEGATIVE
Blood, UA: 10
Glucose, UA: NEGATIVE
Ketones, UA: NEGATIVE
Leukocytes, UA: NEGATIVE
Nitrite, UA: NEGATIVE
Protein, UA: NEGATIVE
Spec Grav, UA: 1.015 (ref 1.010–1.025)
Urobilinogen, UA: NEGATIVE E.U./dL — AB
pH, UA: 5.5 (ref 5.0–8.0)

## 2022-10-25 LAB — BASIC METABOLIC PANEL
BUN: 15 mg/dL (ref 6–23)
CO2: 29 mEq/L (ref 19–32)
Calcium: 9.4 mg/dL (ref 8.4–10.5)
Chloride: 107 mEq/L (ref 96–112)
Creatinine, Ser: 0.93 mg/dL (ref 0.40–1.20)
GFR: 71.85 mL/min (ref >60.00)
Glucose, Bld: 81 mg/dL (ref 70–99)
Potassium: 4.3 mEq/L (ref 3.5–5.1)
Sodium: 144 mEq/L (ref 135–145)

## 2022-10-25 MED ORDER — ZEPBOUND 2.5 MG/0.5ML ~~LOC~~ SOAJ: 2.5000 mg | SUBCUTANEOUS | 0 refills | Status: DC

## 2022-10-25 NOTE — Progress Notes (Signed)
[facility] [address] [place] [address] Dept: [phone] Dept Fax: [phone]  Office Visit  Subjective:    Patient ID: Crystal Shea, female    DOB: [birth year], 52 y.o..   MRN: [id number]  Chief Complaint  Patient presents with   Acute Visit    Back spasms since 12/22. Wants to discuss weight management.    History of Present Illness:  Patient is in today for reassessment of back spasms. She notes she was seen at Urgent Care on 12/22. She was found to have muscular back pain in the right paraspinal muscles and buttocks. She was managed with an injection of Toradol and advised to use ibuprofen 800 mg and cyclobenzaprine.. Crystal Shea did note some early improvement in her symptoms, but now the spasms have returned. She has also noted some urinary frequency/urgency. She denies any fever and is not having any nausea.  Crystal Shea has a history of obesity. Due to a strong family history of diabetes, stroke, and heart disease, she has been working at weight loss. She had previously been treated with phentermine for her weight, but failed to have significant loss. She was started initially on semaglutide [facility]) and had a good response, though she did have some associated nausea and memory issues. Due to an insurance issue, she was then transitioned to liraglutide. Her weight loss had plateaued. In July, we ahd agreed to stop the medication and have her focus on her diet and exercise. She admits that she has fallen off of her regular exercise routine, blaming the cold weather. She has had a significnat regaining of weight.  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   Allergic  conjunctivitis of both eyes 10/28/2021   Prediabetes 10/28/2021   Irregular periods 02/26/2020   Menopausal symptom 02/26/2020   Multiple thyroid nodules 02/26/2020   Essential hypertension 12/14/2018   Class 1 obesity 01/26/2017   Hoarseness 02/04/2014   Seasonal allergic rhinitis 02/04/2014   Mixed hyperlipidemia 12/11/2012   Past Surgical History:  Procedure Laterality Date   CESAREAN SECTION     x2   CHOLECYSTECTOMY, LAPAROSCOPIC     COLONOSCOPY     OPEN REDUCTION INTERNAL FIXATION (ORIF) METACARPAL Left    5th   TUBAL LIGATION     Family History  Problem Relation Age of Onset   Heart disease Mother    Hypertension Mother    Diabetes Mother    Heart attack Mother 88   COPD Father    Diabetes Father    Diabetes Sister    Heart disease Sister    Stroke Sister    Kidney cancer Sister    Heart disease Sister    Heart failure Sister    Heart attack Sister 73   Kidney cancer Sister    Heart disease Sister    Heart failure Sister    Seizures Sister    Stroke Maternal Grandmother    Diabetes Maternal Grandmother    Diabetes Maternal Grandfather    Heart disease Paternal Grandmother    Seizures Daughter    Asthma Son    Colon cancer Neg Hx    Stomach cancer Neg Hx    Rectal cancer Neg Hx    Outpatient Medications Prior to  Visit  Medication Sig Dispense Refill   atorvastatin (LIPITOR) 40 MG tablet Take 1 tablet (40 mg total) by mouth daily. 90 tablet 3   cetirizine (ZYRTEC) 10 MG tablet Take 10-20 mg by mouth daily as needed for allergies.     ezetimibe (ZETIA) 10 MG tablet Take 1 tablet (10 mg total) by mouth daily after supper. 90 tablet 3   fluticasone (FLONASE) 50 MCG/ACT nasal spray Place into both nostrils daily.     lisinopril-hydrochlorothiazide (ZESTORETIC) 20-12.5 MG tablet Take 1 tablet by mouth daily. 90 tablet 1   Olopatadine HCl 0.2 % SOLN Pataday 0.2 % eye drops  PLACE ONE DROP INTO BOTH EYES DAILY. (Patient taking differently: Place 1 drop into both  eyes daily as needed (allergies).) 2.5 mL 11   Tetrahydrozoline HCl (VISINE OP) Place 1 drop into both eyes daily. For redness     valACYclovir (VALTREX) 500 MG tablet Take 500 mg by mouth as needed.     VITAMIN D PO Take 2 tablets by mouth daily.     ondansetron (ZOFRAN-ODT) 4 MG disintegrating tablet Take 1 tablet (4 mg total) by mouth every 8 (eight) hours as needed for nausea or vomiting. (Patient not taking: Reported on 10/25/2022) 20 tablet 0   SAXENDA 18 MG/3ML SOPN INJECT 3 MG INTO THE SKIN ONCE A WEEK. (Patient not taking: Reported on 10/25/2022) 3 mL 1   No facility-administered medications prior to visit.   No Known Allergies    Objective:   Today's Vitals   10/25/22 1015  BP: 122/78  Pulse: 64  Temp: 98.1 F (36.7 C)  TempSrc: Oral  SpO2: 97%  Weight: 190 lb (86.2 kg)  Height: '5\' 3"'$  (1.6 m)   Body mass index is 33.66 kg/m.   General: Well developed, well nourished. No acute distress. Back: Straight. Mild right CVA tenderness. Neuro: No focal neurological deficits. Psych: Alert and oriented. Normal mood and affect.  Health Maintenance Due  Topic Date Due   HIV Screening  Never done   INFLUENZA VACCINE  05/16/2022   COVID-19 Vaccine (4 - 2023-24 season) 06/16/2022   MAMMOGRAM  09/03/2022   Zoster Vaccines- Shingrix (1 of 2) Never done   Lab Results     omponent Ref Range & Units 10:40  Color, UA  yellow  Clarity, UA  clear  Glucose, UA Negative Negative  Bilirubin, UA  neg  Ketones, UA  neg  Spec Grav, UA 1.010 - 1.025 1.015  Blood, UA  10 ery/ul  pH, UA 5.0 - 8.0 5.5  Protein, UA Negative Negative  Urobilinogen, UA 0.2 or 1.0 E.U./dL negative Abnormal   Nitrite, UA  neg  Leukocytes, UA Negative Negative     Assessment & Plan:   1. Right flank pain Crystal Shea has right flank pain and microscopic hematuria. IU will plant o check additional labs and refer her for a CT renals tone study.  - Urinalysis, Routine w reflex microscopic - Basic metabolic  panel - CBC with Differential/Platelet - CT RENAL STONE STUDY; Future  2. Urinary frequency No clear evidence of an infection. She might be having some bladder irritation formt he hematuria. I will check a full urinalysis.  - POCT Urinalysis Dipstick - Urinalysis, Routine w reflex microscopic  3. Obesity (BMI 30.0-34.9) Maximum weight: 197 lbs (08/2020) Current weight: 190 lbs Weight change since last visit: + 21 lbs Total weight loss: 7 lbs (3.5%)  Crystal Shea checked with her insurance and was advised she could get coverage for  Zepbound. I am concerned about regain occurring again. I strongly advise a foundation of dietary calorie control and regular exercise.  - tirzepatide (ZEPBOUND) 2.5 MG/0.5ML Pen; Inject 2.5 mg into the skin once a week.  Dispense: 2 mL; Refill: 0   Return for After testing/imaging.   [name], MD

## 2022-10-26 ENCOUNTER — Ambulatory Visit (HOSPITAL_COMMUNITY)
Admission: RE | Admit: 2022-10-26 | Discharge: 2022-10-26 | Disposition: A | Discharge status: Home / Self Care | Payer: BC Managed Care – PPO | Source: Ambulatory Visit | Attending: Family Medicine | Admitting: Family Medicine

## 2022-10-26 ENCOUNTER — Telehealth: Payer: Self-pay

## 2022-10-26 ENCOUNTER — Other Ambulatory Visit (HOSPITAL_COMMUNITY): Payer: Self-pay

## 2022-10-26 DIAGNOSIS — R109 Unspecified abdominal pain: Secondary | ICD-10-CM | POA: Insufficient documentation

## 2022-10-26 NOTE — Telephone Encounter (Signed)
Pharmacy Patient Advocate Encounter   Received notification from [place] that prior authorization for Zepbound 2.'5MG'$ /0.5ML pen-injectors is required/requested.  Per Test Claim: PA required   PA submitted on 10/26/22 to (ins) [employer] via CoverMyMeds Key [id number]  Status is pending

## 2022-10-26 NOTE — Telephone Encounter (Addendum)
Pharmacy Patient Advocate Encounter  Prior Authorization for Zepbound 2.'5MG'$ /0.5ML has been approved.    PA# [id number] Effective dates: 10/26/2022 through 06/27/2023  Spoke with pharmacy to process

## 2022-10-27 ENCOUNTER — Other Ambulatory Visit (HOSPITAL_COMMUNITY): Payer: Self-pay

## 2022-10-27 NOTE — Telephone Encounter (Signed)
Patient notified VIA phone and will call pharmacy. Dm/cma

## 2022-11-03 ENCOUNTER — Encounter: Payer: Self-pay | Admitting: Family Medicine

## 2022-11-03 NOTE — Telephone Encounter (Signed)
Noted. Dm/cma  

## 2022-11-06 ENCOUNTER — Encounter: Payer: Self-pay | Admitting: Family Medicine

## 2022-11-06 ENCOUNTER — Ambulatory Visit (INDEPENDENT_AMBULATORY_CARE_PROVIDER_SITE_OTHER): Payer: BC Managed Care – PPO | Admitting: Family Medicine

## 2022-11-06 VITALS — BP 122/78 | HR 81 | Temp 96.9°F | Ht 63.0 in | Wt 192.0 lb

## 2022-11-06 DIAGNOSIS — R3129 Other microscopic hematuria: Secondary | ICD-10-CM

## 2022-11-06 DIAGNOSIS — E669 Obesity, unspecified: Secondary | ICD-10-CM

## 2022-11-06 LAB — POCT URINALYSIS DIPSTICK
Bilirubin, UA: NEGATIVE
Blood, UA: 10
Glucose, UA: NEGATIVE
Ketones, UA: NEGATIVE
Nitrite, UA: 2
Protein, UA: NEGATIVE
Spec Grav, UA: >=1.03 — AB (ref 1.010–1.025)
Urobilinogen, UA: NEGATIVE E.U./dL — AB
pH, UA: 5.5 (ref 5.0–8.0)

## 2022-11-06 NOTE — Progress Notes (Signed)
[facility] [address] [place] [address] Dept: [phone] Dept Fax: [phone]  Office Visit  Subjective:    Patient ID: Crystal Shea, female    DOB: [birth year], 52 y.o..   MRN: [id number]  Chief Complaint  Patient presents with   Follow-up    No Concerns, discuss test results, medication for weight loss    History of Present Illness:  Patient is in today for reassessment of her flank pain. I saw Crystal Shea on 1/10 in follow-up of an ED visit regarding flank pain.back spasms. Her dipstick urine had shown some blood present. I sent her for a Cts can to assess for possible kidney stones. She notes the flank pain has improved at this point.  Crystal Shea has a history of Class 2 obesity. She was previously treated under Crystal Shea with semaglutide [facility]) and had a good response, though she did have some associated nausea and memory issues. Due to an insurance issue, she was then transitioned to liraglutide, however, her weight plateaued. At her last visit, I tried to prescribe tirzepatide (Zepbound). She notes her yearly deductible would be $1,000.00 She was not prepared to pay this at the current time. She is engaged in dietary and exercise, though not as much as she did in the past. She does note one of her issues being binge eating at times.  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   Allergic conjunctivitis of both eyes 10/28/2021   Prediabetes 10/28/2021   Irregular periods 02/26/2020   Menopausal symptom 02/26/2020   Multiple thyroid nodules 02/26/2020   Essential hypertension 12/14/2018   Class 1 obesity 01/26/2017   Hoarseness 02/04/2014   Seasonal  allergic rhinitis 02/04/2014   Mixed hyperlipidemia 12/11/2012   Past Surgical History:  Procedure Laterality Date   CESAREAN SECTION     x2   CHOLECYSTECTOMY, LAPAROSCOPIC     COLONOSCOPY     OPEN REDUCTION INTERNAL FIXATION (ORIF) METACARPAL Left    5th   TUBAL LIGATION     Family History  Problem Relation Age of Onset   Heart disease Mother    Hypertension Mother    Diabetes Mother    Heart attack Mother 79   COPD Father    Diabetes Father    Diabetes Sister    Heart disease Sister    Stroke Sister    Kidney cancer Sister    Heart disease Sister    Heart failure Sister    Heart attack Sister 61   Kidney cancer Sister    Heart disease Sister    Heart failure Sister    Seizures Sister    Stroke Maternal Grandmother    Diabetes Maternal Grandmother    Diabetes Maternal Grandfather    Heart disease Paternal Grandmother    Seizures Daughter    Asthma Son    Colon cancer Neg Hx    Stomach cancer Neg Hx    Rectal cancer Neg Hx    Outpatient Medications Prior to Visit  Medication Sig Dispense Refill   atorvastatin (LIPITOR) 40 MG tablet Take 1 tablet (40 mg total) by mouth daily. 90 tablet 3   cetirizine (ZYRTEC) 10 MG tablet Take 10-20 mg by mouth daily as needed for allergies.  ezetimibe (ZETIA) 10 MG tablet Take 1 tablet (10 mg total) by mouth daily after supper. 90 tablet 3   fluticasone (FLONASE) 50 MCG/ACT nasal spray Place into both nostrils daily.     lisinopril-hydrochlorothiazide (ZESTORETIC) 20-12.5 MG tablet Take 1 tablet by mouth daily. 90 tablet 1   Olopatadine HCl 0.2 % SOLN Pataday 0.2 % eye drops  PLACE ONE DROP INTO BOTH EYES DAILY. (Patient taking differently: Place 1 drop into both eyes daily as needed (allergies).) 2.5 mL 11   Tetrahydrozoline HCl (VISINE OP) Place 1 drop into both eyes daily. For redness     ondansetron (ZOFRAN-ODT) 4 MG disintegrating tablet Take 1 tablet (4 mg total) by mouth every 8 (eight) hours as needed for nausea or  vomiting. (Patient not taking: Reported on 10/25/2022) 20 tablet 0   SAXENDA 18 MG/3ML SOPN INJECT 3 MG INTO THE SKIN ONCE A WEEK. (Patient not taking: Reported on 10/25/2022) 3 mL 1   tirzepatide (ZEPBOUND) 2.5 MG/0.5ML Pen Inject 2.5 mg into the skin once a week. (Patient not taking: Reported on 11/06/2022) 2 mL 0   valACYclovir (VALTREX) 500 MG tablet Take 500 mg by mouth as needed. (Patient not taking: Reported on 11/06/2022)     VITAMIN D PO Take 2 tablets by mouth daily. (Patient not taking: Reported on 11/06/2022)     No facility-administered medications prior to visit.   No Known Allergies    Objective:   Today's Vitals   11/06/22 1506  BP: 122/78  Pulse: 81  Temp: (!) 96.9 F (36.1 C)  TempSrc: Tympanic  SpO2: 98%  Weight: 192 lb (87.1 kg)  Height: '5\' 3"'$  (1.6 m)   Body mass index is 34.01 kg/m.   General: Well developed, well nourished. No acute distress. Psych: Alert and oriented. Normal mood and affect.  Health Maintenance Due  Topic Date Due   HIV Screening  Never done   INFLUENZA VACCINE  05/16/2022   COVID-19 Vaccine (4 - 2023-24 season) 06/16/2022   Zoster Vaccines- Shingrix (1 of 2) Never done   Lab Results Component Ref Range & Units 15:04 (11/06/22)  Color, UA  dark yellow  Clarity, UA  clear  Glucose, UA Negative Negative  Bilirubin, UA  neg  Ketones, UA  neg  Spec Grav, UA 1.010 - 1.025 >=1.030 Abnormal   Blood, UA  10 ery/ul  pH, UA 5.0 - 8.0 5.5  Protein, UA Negative Negative  Urobilinogen, UA 0.2 or 1.0 E.U./dL negative Abnormal   Nitrite, UA  2  Leukocytes, UA Negative Trace Abnormal    Imaging: CT Renal Stone Study (10/26/2022) IMPRESSION: No evidence of urolithiasis, hydronephrosis, or other acute findings.   Stable small uterine fibroid.    Assessment & Plan:   1. Microscopic hematuria Crystal Shea persists in havign microscopic hematuria noted on ehr dipstick. I will refer her to urology for an assessment for potential causes of  this.  - POCT Urinalysis Dipstick - Ambulatory referral to Urology  2. Class 1 obesity We reviewed potential options. I encouraged her to continue to work on the foundation of a healthy, low calorie diet and regular exercise. I will refer her tot he [facility] for further evaluation and management options.  - Amb Ref to Medical Weight Management   Return if symptoms worsen or fail to improve.   [name], MD

## 2022-11-14 ENCOUNTER — Telehealth: Payer: Self-pay

## 2022-11-14 NOTE — Telephone Encounter (Signed)
Pa for [facility] submitted through cover my meds.  Awaiting response. Dm/cma  Key # [id number]

## 2022-11-16 ENCOUNTER — Encounter: Payer: Self-pay | Admitting: Family Medicine

## 2022-11-16 DIAGNOSIS — E669 Obesity, unspecified: Secondary | ICD-10-CM

## 2022-11-17 ENCOUNTER — Other Ambulatory Visit: Payer: Self-pay | Admitting: Family Medicine

## 2022-11-17 DIAGNOSIS — E669 Obesity, unspecified: Secondary | ICD-10-CM

## 2022-11-17 MED ORDER — WEGOVY 0.25 MG/0.5ML ~~LOC~~ SOAJ: 0.2500 mg | SUBCUTANEOUS | 0 refills | Status: DC

## 2022-11-17 MED ORDER — WEGOVY 0.5 MG/0.5ML ~~LOC~~ SOAJ: 0.5000 mg | SUBCUTANEOUS | 0 refills | Status: DC

## 2022-11-24 ENCOUNTER — Other Ambulatory Visit: Payer: Self-pay | Admitting: Family Medicine

## 2022-11-24 DIAGNOSIS — I10 Essential (primary) hypertension: Secondary | ICD-10-CM

## 2022-12-03 ENCOUNTER — Other Ambulatory Visit: Payer: Self-pay | Admitting: Family Medicine

## 2022-12-03 DIAGNOSIS — E669 Obesity, unspecified: Secondary | ICD-10-CM

## 2022-12-13 ENCOUNTER — Telehealth (INDEPENDENT_AMBULATORY_CARE_PROVIDER_SITE_OTHER): Payer: BC Managed Care – PPO | Admitting: Family Medicine

## 2022-12-13 VITALS — Ht 63.0 in | Wt 192.0 lb

## 2022-12-13 DIAGNOSIS — U071 COVID-19: Secondary | ICD-10-CM | POA: Diagnosis not present

## 2022-12-13 MED ORDER — NIRMATRELVIR/RITONAVIR (PAXLOVID)TABLET: 3.0000 | ORAL_TABLET | Freq: Two times a day (BID) | ORAL | 0 refills | Status: AC

## 2022-12-13 NOTE — Assessment & Plan Note (Signed)
Reviewed home care instructions for COVID, including rest, pushing fluids, and OTC medications as needed for symptom relief. Recommend hot tea with honey for sore throat symptoms. I will prescribe Paxlovid. Advised self-isolation at home for at least 5 days. After 5 days, if improved and fever resolved, can be in public, but should wear a mask around others for an additional 5 days. If symptoms, esp, dyspnea develops/worsens, recommend in-person evaluation at either an urgent care or the emergency room.

## 2022-12-13 NOTE — Progress Notes (Signed)
[facility] [address] [place] [address] Dept: [phone] Dept Fax: [phone]  Virtual Video Visit  I connected with Crystal Shea on 12/13/22 at  2:00 PM EST by a video enabled telemedicine application and verified that I am speaking with the correct person using two identifiers.  Location patient: Home Location provider: Clinic Persons participating in the virtual visit: Patient, Provider  I discussed the limitations of evaluation and management by telemedicine and the availability of in person appointments. The patient expressed understanding and agreed to proceed.  Chief Complaint  Patient presents with   Covid Positive    C/o fever, N&V, sinus/chest congestion, body aches x 4 days.  Positive for Covid  12/12/22.  has taken Robitussin and Mucinex.    SUBJECTIVE:  HPI: Crystal Shea is a 52 y.o. female who presents with a 4-day history of cough, sinus congestion, sneezing, subjective fever, N/V, poor taste, decreased appetite, and body aches. She notes that on Sunday at church, another church member hugged her and noted then that she was running feve.r Ms. Beier went home at that point. She has been using Mucinex and Robitussin.  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   Allergic conjunctivitis of both eyes 10/28/2021   Prediabetes 10/28/2021   Irregular periods 02/26/2020   Menopausal symptom 02/26/2020   Multiple thyroid nodules 02/26/2020   Essential hypertension 12/14/2018   Class 1 obesity 01/26/2017   Hoarseness 02/04/2014   Seasonal allergic rhinitis 02/04/2014   Mixed hyperlipidemia 12/11/2012   Past Surgical History:  Procedure Laterality Date    CESAREAN SECTION     x2   CHOLECYSTECTOMY, LAPAROSCOPIC     COLONOSCOPY     OPEN REDUCTION INTERNAL FIXATION (ORIF) METACARPAL Left    5th   TUBAL LIGATION     Family History  Problem Relation Age of Onset   Heart disease Mother    Hypertension Mother    Diabetes Mother    Heart attack Mother 48   COPD Father    Diabetes Father    Diabetes Sister    Heart disease Sister    Stroke Sister    Kidney cancer Sister    Heart disease Sister    Heart failure Sister    Heart attack Sister 59   Kidney cancer Sister    Heart disease Sister    Heart failure Sister    Seizures Sister    Stroke Maternal Grandmother    Diabetes Maternal Grandmother    Diabetes Maternal Grandfather    Heart disease Paternal Grandmother    Seizures Daughter    Asthma Son    Colon cancer Neg Hx    Stomach cancer Neg Hx    Rectal cancer Neg Hx    Social History   Tobacco Use   Smoking status: Never   Smokeless tobacco: Never  Vaping Use   Vaping Use: Never used  Substance Use Topics   Alcohol use: Yes    Comment: occasional   Drug use: No    Current Outpatient Medications:    atorvastatin (LIPITOR) 40 MG tablet, Take 1 tablet (40 mg total) by mouth daily., Disp: 90 tablet, Rfl: 3   cetirizine (ZYRTEC) 10 MG tablet, Take 10-20 mg by  mouth daily as needed for allergies., Disp: , Rfl:    cyclobenzaprine (FLEXERIL) 5 MG tablet, Take by mouth., Disp: , Rfl:    ezetimibe (ZETIA) 10 MG tablet, Take 1 tablet (10 mg total) by mouth daily after supper., Disp: 90 tablet, Rfl: 3   fluticasone (FLONASE) 50 MCG/ACT nasal spray, Place into both nostrils daily., Disp: , Rfl:    lisinopril-hydrochlorothiazide (ZESTORETIC) 20-12.5 MG tablet, TAKE 1 TABLET BY MOUTH EVERY DAY, Disp: 90 tablet, Rfl: 0   nirmatrelvir/ritonavir (PAXLOVID) 20 x 150 MG & 10 x '100MG'$  TABS, Take 3 tablets by mouth 2 (two) times daily for 5 days. (Take nirmatrelvir 150 mg two tablets twice daily for 5 days and ritonavir 100 mg one tablet  twice daily for 5 days) Patient GFR is 71, Disp: 30 tablet, Rfl: 0   Olopatadine HCl 0.2 % SOLN, Pataday 0.2 % eye drops  PLACE ONE DROP INTO BOTH EYES DAILY. (Patient taking differently: Place 1 drop into both eyes daily as needed (allergies).), Disp: 2.5 mL, Rfl: 11   Semaglutide-Weight Management (WEGOVY) 0.25 MG/0.5ML SOAJ, Inject 0.25 mg into the skin once a week., Disp: 2 mL, Rfl: 0   Semaglutide-Weight Management (WEGOVY) 0.5 MG/0.5ML SOAJ, Inject 0.5 mg into the skin once a week. Start after 28 days on the 0.25 mg dose., Disp: 2 mL, Rfl: 0   Tetrahydrozoline HCl (VISINE OP), Place 1 drop into both eyes daily. For redness, Disp: , Rfl:  No Known Allergies ROS: See pertinent positives and negatives per HPI.  OBSERVATIONS/OBJECTIVE:  VITALS per patient if applicable: Today's Vitals   12/13/22 1405  Weight: 192 lb (87.1 kg)  Height: '5\' 3"'$  (1.6 m)   Body mass index is 34.01 kg/m.   ASSESSMENT AND PLAN:  Problem List Items Addressed This Visit       Other   COVID-19 - Primary    Reviewed home care instructions for COVID, including rest, pushing fluids, and OTC medications as needed for symptom relief. Recommend hot tea with honey for sore throat symptoms. I will prescribe Paxlovid. Advised self-isolation at home for at least 5 days. After 5 days, if improved and fever resolved, can be in public, but should wear a mask around others for an additional 5 days. If symptoms, esp, dyspnea develops/worsens, recommend in-person evaluation at either an urgent care or the emergency room.       Relevant Medications   nirmatrelvir/ritonavir (PAXLOVID) 20 x 150 MG & 10 x '100MG'$  TABS     I discussed the assessment and treatment plan with the patient. The patient was provided an opportunity to ask questions and all were answered. The patient agreed with the plan and demonstrated an understanding of the instructions.   The patient was advised to call back or seek an in-person evaluation if the  symptoms worsen or if the condition fails to improve as anticipated.  Return if symptoms worsen or fail to improve.   [name], MD

## 2022-12-17 ENCOUNTER — Other Ambulatory Visit: Payer: Self-pay | Admitting: Family Medicine

## 2022-12-17 DIAGNOSIS — E669 Obesity, unspecified: Secondary | ICD-10-CM

## 2022-12-19 ENCOUNTER — Telehealth: Payer: Self-pay

## 2022-12-19 ENCOUNTER — Other Ambulatory Visit (HOSPITAL_COMMUNITY): Payer: Self-pay

## 2022-12-19 NOTE — Telephone Encounter (Signed)
Pharmacy Patient Advocate Encounter   Received notification that prior authorization for [facility] is required/requested.  Per Test Claim: Prior authorization required   PA submitted on 12/19/22 to (ins) [employer] via CoverMyMeds Key  # [id number] Status is pending  Previous prior authorization (key# [id number]) was denied because the additional clinical information was not sent in.

## 2022-12-20 NOTE — Telephone Encounter (Signed)
Patient Advocate Encounter  Prior Authorization for [employer] 0.'25mg'$ /0.3m has been approved.    PA# [id number] Non-Grandfathered [id number] Effective dates: 12/19/22 through 07/21/23  Placed a call to [facility] to notify of the approval. Spoke with the pharmacist and she stated all strengths of WMancel Parsonsis now on a backorder with no date available.   Approval letter attached to chart.

## 2023-01-23 ENCOUNTER — Encounter: Payer: Self-pay | Admitting: Family Medicine

## 2023-01-23 ENCOUNTER — Ambulatory Visit: Payer: BC Managed Care – PPO | Admitting: Family Medicine

## 2023-01-23 VITALS — BP 128/70 | HR 71 | Temp 98.3°F | Ht 63.0 in | Wt 187.8 lb

## 2023-01-23 DIAGNOSIS — E669 Obesity, unspecified: Secondary | ICD-10-CM | POA: Diagnosis not present

## 2023-01-23 MED ORDER — WEGOVY 1.7 MG/0.75ML ~~LOC~~ SOAJ: 1.7000 mg | SUBCUTANEOUS | 0 refills | Status: DC

## 2023-01-23 MED ORDER — WEGOVY 1 MG/0.5ML ~~LOC~~ SOAJ: 1.0000 mg | SUBCUTANEOUS | 0 refills | Status: DC

## 2023-01-23 NOTE — Progress Notes (Signed)
[facility] [address] [place] [address] Dept: [phone] Dept Fax: [phone]  Chronic Care Office Visit  Subjective:    Patient ID: Crystal Shea, female    DOB: 11-22-1970, 52 y.o..   MRN: [id number]  Chief Complaint  Patient presents with   Medical Management of Chronic Issues    F/u weight loss/meds.  No concerns.    History of Present Illness:  Patient is in today for reassessment of chronic medical issues.  Ms. Gedney has a history of Class 2 obesity. She was previously treated under Dr. Barron Alvine with semaglutide [facility]) and had a good response, though she did have some associated nausea and memory issues. Due to an insurance issue, she was then transitioned to liraglutide, however, her weight plateaued. At her last visit, I tried to prescribe tirzepatide (Zepbound). She was unable to afford the co-pay for this. She is engaged in dietary changes and exercise. We were able to restart her on semaglutide [facility]), She has completed 2 months and is tolerating the therapy.  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   Allergic conjunctivitis of both eyes 10/28/2021   Prediabetes 10/28/2021   Irregular periods 02/26/2020   Menopausal symptom 02/26/2020   Multiple thyroid nodules 02/26/2020   Essential hypertension 12/14/2018   Class 1 obesity 01/26/2017   Hoarseness 02/04/2014   Seasonal allergic rhinitis 02/04/2014   Mixed hyperlipidemia 12/11/2012   Past Surgical History:  Procedure Laterality Date   CESAREAN SECTION     x2   CHOLECYSTECTOMY, LAPAROSCOPIC     COLONOSCOPY     OPEN REDUCTION INTERNAL FIXATION (ORIF) METACARPAL Left     5th   TUBAL LIGATION     Family History  Problem Relation Age of Onset   Heart disease Mother    Hypertension Mother    Diabetes Mother    Heart attack Mother 77   COPD Father    Diabetes Father    Diabetes Sister    Heart disease Sister    Stroke Sister    Kidney cancer Sister    Heart disease Sister    Heart failure Sister    Heart attack Sister 96   Kidney cancer Sister    Heart disease Sister    Heart failure Sister    Seizures Sister    Stroke Maternal Grandmother    Diabetes Maternal Grandmother    Diabetes Maternal Grandfather    Heart disease Paternal Grandmother    Seizures Daughter    Asthma Son    Colon cancer Neg Hx    Stomach cancer Neg Hx    Rectal cancer Neg Hx    Outpatient Medications Prior to Visit  Medication Sig Dispense Refill   atorvastatin (LIPITOR) 40 MG tablet Take 1 tablet (40 mg total) by mouth daily. 90 tablet 3   cetirizine (ZYRTEC) 10 MG tablet Take 10-20 mg by mouth daily as needed for allergies.     cyclobenzaprine (FLEXERIL) 5 MG tablet Take by mouth.     ezetimibe (ZETIA) 10 MG tablet Take 1 tablet (10 mg total) by mouth daily after supper. 90 tablet 3   fluticasone (FLONASE) 50 MCG/ACT nasal spray Place into both nostrils daily.     lisinopril-hydrochlorothiazide (ZESTORETIC)  20-12.5 MG tablet TAKE 1 TABLET BY MOUTH EVERY DAY 90 tablet 0   Olopatadine HCl 0.2 % SOLN Pataday 0.2 % eye drops  PLACE ONE DROP INTO BOTH EYES DAILY. (Patient taking differently: Place 1 drop into both eyes daily as needed (allergies).) 2.5 mL 11   Semaglutide-Weight Management (WEGOVY) 0.5 MG/0.5ML SOAJ Inject 0.5 mg into the skin once a week. Start after 28 days on the 0.25 mg dose. 2 mL 0   Tetrahydrozoline HCl (VISINE OP) Place 1 drop into both eyes daily. For redness     Semaglutide-Weight Management (WEGOVY) 0.25 MG/0.5ML SOAJ Inject 0.25 mg into the skin once a week. 2 mL 0   No facility-administered medications prior to visit.   No Known  Allergies Objective:   Today's Vitals   01/23/23 1340  BP: 128/70  Pulse: 71  Temp: 98.3 F (36.8 C)  TempSrc: Temporal  SpO2: 100%  Weight: 187 lb 12.8 oz (85.2 kg)  Height: 5\' 3"  (1.6 m)   Body mass index is 33.27 kg/m.   General: Well developed, well nourished. No acute distress. Psych: Alert and oriented. Normal mood and affect.  Health Maintenance Due  Topic Date Due   HIV Screening  Never done   Zoster Vaccines- Shingrix (1 of 2) Never done     Assessment & Plan:   Problem List Items Addressed This Visit       Other   Class 1 obesity - Primary    Maximum weight: 197  lbs (08/2020) Current weight: 187 lbs Weight change since last visit: - 5 lbs Total weight loss: 10 lbs (5%)  Tolerating treatment with CLEXNT. I will increase her dose to 1 mg weekly for the next month and then 1.7 mg weekly. I will plan to see her back in 2 months.      Relevant Medications   Semaglutide-Weight Management (WEGOVY) 1 MG/0.5ML SOAJ   Semaglutide-Weight Management (WEGOVY) 1.7 MG/0.75ML SOAJ   Return in about 2 months (around 03/25/2023) for Reassessment.   Loyola Mast, MD

## 2023-01-23 NOTE — Assessment & Plan Note (Signed)
Maximum weight: 197  lbs (08/2020) Current weight: 187 lbs Weight change since last visit: - 5 lbs Total weight loss: 10 lbs (5%)  Tolerating treatment with YQMVHQ. I will increase her dose to 1 mg weekly for the next month and then 1.7 mg weekly. I will plan to see her back in 2 months.

## 2023-01-27 ENCOUNTER — Other Ambulatory Visit: Payer: Self-pay | Admitting: Family Medicine

## 2023-02-14 ENCOUNTER — Encounter (INDEPENDENT_AMBULATORY_CARE_PROVIDER_SITE_OTHER): Payer: BC Managed Care – PPO | Admitting: Family Medicine

## 2023-02-14 DIAGNOSIS — Z0289 Encounter for other administrative examinations: Secondary | ICD-10-CM

## 2023-02-19 ENCOUNTER — Other Ambulatory Visit: Payer: Self-pay | Admitting: Family Medicine

## 2023-02-19 DIAGNOSIS — I10 Essential (primary) hypertension: Secondary | ICD-10-CM

## 2023-03-09 ENCOUNTER — Other Ambulatory Visit: Payer: Self-pay | Admitting: Family Medicine

## 2023-03-09 DIAGNOSIS — E782 Mixed hyperlipidemia: Secondary | ICD-10-CM

## 2023-03-16 ENCOUNTER — Ambulatory Visit: Payer: BC Managed Care – PPO | Admitting: Family Medicine

## 2023-03-16 VITALS — BP 114/68 | HR 60 | Temp 98.3°F | Ht 63.0 in | Wt 198.0 lb

## 2023-03-16 DIAGNOSIS — M7989 Other specified soft tissue disorders: Secondary | ICD-10-CM | POA: Diagnosis not present

## 2023-03-16 DIAGNOSIS — E669 Obesity, unspecified: Secondary | ICD-10-CM

## 2023-03-16 DIAGNOSIS — Z6835 Body mass index (BMI) 35.0-35.9, adult: Secondary | ICD-10-CM | POA: Diagnosis not present

## 2023-03-16 DIAGNOSIS — T189XXA Foreign body of alimentary tract, part unspecified, initial encounter: Secondary | ICD-10-CM

## 2023-03-16 MED ORDER — FUROSEMIDE 20 MG PO TABS: 20.0000 mg | ORAL_TABLET | Freq: Every day | ORAL | 0 refills | Status: DC

## 2023-03-16 NOTE — Assessment & Plan Note (Signed)
Weight is up 10 lbs. I agree with her plan to be seen at the [facility].

## 2023-03-16 NOTE — Progress Notes (Signed)
[facility] [address] [place] [address] Dept: [phone] Dept Fax: [phone]  Office Visit  Subjective:    Patient ID: Crystal Shea, female    DOB: 10/22/1970, 52 y.o..   MRN: [id number]  Chief Complaint  Patient presents with   Leg Swelling    C/o having bilateral leg swelling x 1 week.    History of Present Illness:  Patient is in today complaining of bilateral lower leg swelling. She has had this in the past. She feels the right leg is larger than the left. She has some mild discomfort, but no pain. Since her last visit, she had to stop using Wegovy, as her insurance stopped paying for this. Since stopping, she feels she has been binging food much more. She doesn't feel that this has involved salty foods too much. She is scheduled to be seen at the [facility] soon.  Crystal Shea notes that a few days ago, she had opened a bottle of Martinelli's. While drinking this, she accidentally swallowed a sliver of glass that was in the bottle. She tried to make herself vomit, but never saw the glass, though she did see a small amount of blood. She then ate a piece of loaf bread to help make sure it passed into the stomach. She has not had any issues since then.   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   Allergic conjunctivitis of both eyes 10/28/2021   Prediabetes 10/28/2021   Irregular periods 02/26/2020   Menopausal symptom 02/26/2020   Multiple thyroid nodules 02/26/2020   Essential hypertension 12/14/2018   Class 1 obesity 01/26/2017   Hoarseness 02/04/2014   Seasonal allergic rhinitis 02/04/2014   Mixed hyperlipidemia 12/11/2012    Past Surgical History:  Procedure Laterality Date   CESAREAN SECTION     x2   CHOLECYSTECTOMY, LAPAROSCOPIC     COLONOSCOPY     OPEN REDUCTION INTERNAL FIXATION (ORIF) METACARPAL Left    5th   TUBAL LIGATION     Family History  Problem Relation Age of Onset   Heart disease Mother    Hypertension Mother    Diabetes Mother    Heart attack Mother 53   COPD Father    Diabetes Father    Diabetes Sister    Heart disease Sister    Stroke Sister    Kidney cancer Sister    Heart disease Sister    Heart failure Sister    Heart attack Sister 48   Kidney cancer Sister    Heart disease Sister    Heart failure Sister    Seizures Sister    Stroke Maternal Grandmother    Diabetes Maternal Grandmother    Diabetes Maternal Grandfather    Heart disease Paternal Grandmother    Seizures Daughter    Asthma Son    Colon cancer Neg Hx    Stomach cancer Neg Hx    Rectal cancer Neg Hx    Outpatient Medications Prior to Visit  Medication Sig Dispense Refill   atorvastatin (LIPITOR) 40 MG tablet TAKE 1 TABLET BY MOUTH EVERY DAY 90 tablet 3   cetirizine (ZYRTEC) 10 MG tablet Take 10-20 mg by mouth daily as needed for allergies.  cyclobenzaprine (FLEXERIL) 5 MG tablet Take by mouth.     ezetimibe (ZETIA) 10 MG tablet Take 1 tablet (10 mg total) by mouth daily after supper. 90 tablet 3   fluticasone (FLONASE) 50 MCG/ACT nasal spray Place into both nostrils daily.     lisinopril-hydrochlorothiazide (ZESTORETIC) 20-12.5 MG tablet TAKE 1 TABLET BY MOUTH EVERY DAY 90 tablet 0   Olopatadine HCl 0.2 % SOLN Pataday 0.2 % eye drops  PLACE ONE DROP INTO BOTH EYES DAILY. (Patient taking differently: Place 1 drop into both eyes daily as needed (allergies).) 2.5 mL 11   Tetrahydrozoline HCl (VISINE OP) Place 1 drop into both eyes daily. For redness     Semaglutide-Weight Management (WEGOVY) 0.5 MG/0.5ML SOAJ Inject 0.5 mg into the skin once a week. Start after 28 days on the 0.25 mg dose. (Patient not  taking: Reported on 03/16/2023) 2 mL 0   Semaglutide-Weight Management (WEGOVY) 1 MG/0.5ML SOAJ Inject 1 mg into the skin once a week. (Patient not taking: Reported on 03/16/2023) 2 mL 0   Semaglutide-Weight Management (WEGOVY) 1.7 MG/0.75ML SOAJ Inject 1.7 mg into the skin once a week. Start after 4 weeks on the 1 mg weekly dose. (Patient not taking: Reported on 03/16/2023) 3 mL 0   No facility-administered medications prior to visit.   No Known Allergies   Objective:   Today's Vitals   03/16/23 1501  BP: 114/68  Pulse: 60  Temp: 98.3 F (36.8 C)  TempSrc: Temporal  SpO2: 100%  Weight: 198 lb (89.8 kg)  Height: 5\' 3"  (1.6 m)   Body mass index is 35.07 kg/m.   General: Well developed, well nourished. No acute distress. Extremities: 1+ edema of both lower legs. The right leg appears slightly larger than the left. No pain on   palpation. Psych: Alert and oriented. Normal mood and affect.  Health Maintenance Due  Topic Date Due   HIV Screening  Never done   Zoster Vaccines- Shingrix (1 of 2) Never done     Assessment & Plan:   Problem List Items Addressed This Visit       Other   Class 1 obesity    Weight is up 10 lbs. I agree with her plan to be seen at the [facility].      Leg swelling - Primary    I suspect the changes off of her Wegovy and her dietary issues are the underlyign cause. I will check some labs and order an ultrasound to assess for toehr potential causes. iw ill put her on Lasix for a week to try and resolve this.      Relevant Medications   furosemide (LASIX) 20 MG tablet   Other Relevant Orders   VAS [place] LOWER EXTREMITY VENOUS REFLUX   Comprehensive metabolic panel   TSH   Other Visit Diagnoses     Swallowed foreign body, initial encounter- glass       Appears to be doign okay at present. Recommend expectant management.       Return in about 3 months (around 06/16/2023) for Reassessment.   Loyola Mast, MD

## 2023-03-16 NOTE — Assessment & Plan Note (Signed)
I suspect the changes off of her Wegovy and her dietary issues are the underlyign cause. I will check some labs and order an ultrasound to assess for toehr potential causes. iw ill put her on Lasix for a week to try and resolve this.

## 2023-03-17 LAB — COMPREHENSIVE METABOLIC PANEL
AG Ratio: 1.1 (calc) (ref 1.0–2.5)
ALT: 46 U/L — ABNORMAL HIGH (ref 6–29)
AST: 25 U/L (ref 10–35)
Albumin: 3.8 g/dL (ref 3.6–5.1)
Alkaline phosphatase (APISO): 103 U/L (ref 37–153)
BUN: 12 mg/dL (ref 7–25)
CO2: 26 mmol/L (ref 20–32)
Calcium: 9.1 mg/dL (ref 8.6–10.4)
Chloride: 102 mmol/L (ref 98–110)
Creat: 0.78 mg/dL (ref 0.50–1.03)
Globulin: 3.4 g/dL (calc) (ref 1.9–3.7)
Glucose, Bld: 78 mg/dL (ref 65–99)
Potassium: 4.3 mmol/L (ref 3.5–5.3)
Sodium: 139 mmol/L (ref 135–146)
Total Bilirubin: 0.5 mg/dL (ref 0.2–1.2)
Total Protein: 7.2 g/dL (ref 6.1–8.1)

## 2023-03-17 LAB — TSH: TSH: 0.89 mIU/L

## 2023-03-20 ENCOUNTER — Ambulatory Visit (INDEPENDENT_AMBULATORY_CARE_PROVIDER_SITE_OTHER): Payer: BC Managed Care – PPO | Admitting: Family Medicine

## 2023-03-20 ENCOUNTER — Encounter (INDEPENDENT_AMBULATORY_CARE_PROVIDER_SITE_OTHER): Payer: Self-pay | Admitting: Family Medicine

## 2023-03-20 VITALS — BP 110/60 | HR 69 | Temp 98.7°F | Ht 62.0 in | Wt 192.0 lb

## 2023-03-20 DIAGNOSIS — R7303 Prediabetes: Secondary | ICD-10-CM | POA: Diagnosis not present

## 2023-03-20 DIAGNOSIS — I10 Essential (primary) hypertension: Secondary | ICD-10-CM

## 2023-03-20 DIAGNOSIS — E7801 Familial hypercholesterolemia: Secondary | ICD-10-CM

## 2023-03-20 DIAGNOSIS — Z8249 Family history of ischemic heart disease and other diseases of the circulatory system: Secondary | ICD-10-CM

## 2023-03-20 DIAGNOSIS — R5383 Other fatigue: Secondary | ICD-10-CM | POA: Diagnosis not present

## 2023-03-20 DIAGNOSIS — Z1331 Encounter for screening for depression: Secondary | ICD-10-CM

## 2023-03-20 DIAGNOSIS — E559 Vitamin D deficiency, unspecified: Secondary | ICD-10-CM | POA: Diagnosis not present

## 2023-03-20 DIAGNOSIS — R0602 Shortness of breath: Secondary | ICD-10-CM

## 2023-03-20 DIAGNOSIS — F32A Depression, unspecified: Secondary | ICD-10-CM | POA: Diagnosis not present

## 2023-03-20 DIAGNOSIS — E041 Nontoxic single thyroid nodule: Secondary | ICD-10-CM

## 2023-03-20 DIAGNOSIS — Z6835 Body mass index (BMI) 35.0-35.9, adult: Secondary | ICD-10-CM

## 2023-03-20 NOTE — Progress Notes (Signed)
Chief Complaint:   OBESITY Crystal Shea (MR# [id number]) is a 52 y.o. female who presents for evaluation and treatment of obesity and related comorbidities. Current BMI is Body mass index is 35.12 kg/m. Crystal Shea has been struggling with her weight for many years and has been unsuccessful in either losing weight, maintaining weight loss, or reaching her healthy weight goal.  Crystal Shea is currently in the action stage of change and ready to dedicate time achieving and maintaining a healthier weight. Crystal Shea is interested in becoming our patient and working on intensive lifestyle modifications including (but not limited to) diet and exercise for weight loss.  Referred by Dr. Veto Kemps- came to information session.  She has a history of significant constipation where she can sometimes go over a week without a bowel movement. Lactose sensitive. 5 pregnancies in the past with 1 miscarriage no pregnancy complications. Previously tried [place].  Wegovy worked well for her- got down to 168lbs.  Took Phentermine in years past but she reports this worked for a little bit and then stopped. Only got up to 0.5mg  of Wegovy. 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  Lives at home with son and two daughters Crystal Shea, Crystal Shea- 21, Crystal Shea).  Eats Sunday dinners together.  Not anticipating sabotage even at work. She is doing some exercise daily.  Desired weight is 150lbs; last time she was that weight was prior to having her last child.  Eats take out 12-15 times a week: soul food type food, salad from cava, chipotle. There is a likelihood she could decrease this.  Skipping lunch or dinner 3x a week due to time at work.   Food Recall: Coffee in the am with creamer and sugar (3 tbsps of each).  1 piece toast cinnamon raisin with margarine (satisfied) or oatmeal ([employer] with raisins and apples; full) or poptarts (2; feels full).  Will do a snack  around 11am like chips/ pringles (eats around 10-15 chips)- not hungry but starting to feel hungry.  Around 1/2pm lunch which is cabbage (1.5 cup), [place] (1 [place]awi wing) or meatloaf and yams or macaroni (1.5 cup).  Eats half and feels full. Around 6pm she eats again the second half of the lunch.  If she doesn't eat lunch it will be something different but most of the time it is the other half of lunch.   May have a snack of grapes or other fruit.  Every now and then will have a little Debbie cake.  Crystal Shea's habits were reviewed today and are as follows: Her family eats meals together, she thinks her family will eat healthier with her, her desired weight loss is 42 lbs, she has been heavy most of her life, she started gaining weight after her second child, her heaviest weight ever was 198 pounds, she has significant food cravings issues, she snacks frequently in the evenings, she skips meals frequently, she is frequently drinking liquids with calories, she frequently makes poor food choices, she has problems with excessive hunger, she frequently eats larger portions than normal, and she struggles with emotional eating.  Depression Screen Crystal Shea Food and Mood (modified PHQ-9) score was 15.  Subjective:   1. Other fatigue Crystal Shea admits to daytime somnolence and denies waking up still tired. Patient has a history of symptoms of daytime fatigue. Crystal Shea generally gets 6 or 8 hours of sleep per night, and states that she has generally restful sleep.  Snoring is not present. Apneic episodes are not present. Epworth Sleepiness Score is 8.   2. SOBOE (shortness of breath on exertion) Crystal Shea notes increasing shortness of breath with exercising and seems to be worsening over time with weight gain. She notes getting out of breath sooner with activity than she used to. This has not gotten worse recently. Crystal Shea denies shortness of breath at rest or orthopnea.  3. Familial hypercholesterolemia Patient is on  Lipitor 40 mg.  She was diagnosed years ago (may be 2008).  She has a very strong family history (LDL 190 six years ago). She is also on ezetimibe.   4. Prediabetes Patient's last A1c was 5.9 in January 2023.  She is not on medications.  She notes significant carbohydrate cravings.  5. Vitamin D deficiency Patient's last vitamin D level was approximately 1 and half year ago.  She notes fatigue, and she is on a OTC (she thinks 1,000 IU).  6. Primary hypertension Patient's blood pressure is controlled today (lower end of normal).  She has been on medications for a few years.  7. Thyroid nodule Patient has a history of many thyroid nodules.  Last TSH was within normal limits.  8. Family history of early CAD Patient has a significant family history of hyperlipidemia, early MI, and stroke.  Assessment/Plan:   1. Other fatigue Crystal Shea does feel that her weight is causing her energy to be lower than it should be. Fatigue may be related to obesity, depression or many other causes. Labs will be ordered, and in the meanwhile, Crystal Shea will focus on self care including making healthy food choices, increasing physical activity and focusing on stress reduction.  - EKG 12-Lead - Vitamin B12 - Folate  2. SOBOE (shortness of breath on exertion) Crystal Shea does feel that she gets out of breath more easily that she used to when she exercises. Crystal Shea shortness of breath appears to be obesity related and exercise induced. She has agreed to work on weight loss and gradually increase exercise to treat her exercise induced shortness of breath. Will continue to monitor closely.  3. Familial hypercholesterolemia We will check labs today. Patient was referred to Dr. Rennis Golden at [facility].   - Lipid Panel With LDL/HDL Ratio - Ambulatory referral to Cardiology  4. Prediabetes We will check labs today, and we will follow-up ay her next appointment.   - Hemoglobin A1c - Insulin, random  5. Vitamin  D deficiency We will check labs today, and we will follow-up ay her next appointment.   - VITAMIN D 25 Hydroxy (Vit-D Deficiency, Fractures)  6. Primary hypertension We will check labs today, and we will follow-up ay her next appointment.   - CBC with Differential/Platelet  7. Thyroid nodule We will check labs today, and we will follow-up ay her next appointment.   - T4, free - T3  8. Family history of early CAD Patient was referred to Cardiology for further workup and management.  - Ambulatory referral to Cardiology  9. Depression screening Crystal Shea had a positive depression screening. Depression is commonly associated with obesity and often results in emotional eating behaviors. We will monitor this closely and work on CBT to help improve the non-hunger eating patterns. Referral to Psychology may be required if no improvement is seen as she continues in our clinic.  10. Class 2 severe obesity with serious comorbidity and body mass index (BMI) of 35.0 to 35.9 in adult, unspecified obesity type [facility]) Srinithi is currently in the action  stage of change and her goal is to continue with weight loss efforts. I recommend [place] begin the structured treatment plan as follows:  She has agreed to the Category 1 Plan.  Exercise goals: No exercise has been prescribed at this time.   Behavioral modification strategies: increasing lean protein intake, meal planning and cooking strategies, keeping healthy foods in the home, and planning for success.  She was informed of the importance of frequent follow-up visits to maximize her success with intensive lifestyle modifications for her multiple health conditions. She was informed we would discuss her lab results at her next visit unless there is a critical issue that needs to be addressed sooner. Matelyn agreed to keep her next visit at the agreed upon time to discuss these results.  Objective:   Blood pressure 110/60, pulse 69, temperature 98.7 F  (37.1 C), height 5\' 2"  (1.575 m), weight 192 lb (87.1 kg), last menstrual period 02/03/2015, SpO2 100 %. Body mass index is 35.12 kg/m.  EKG: Normal sinus rhythm, rate 63 BPM.  Indirect Calorimeter completed today shows a VO2 of 170 and a REE of 1166.  Her calculated basal metabolic rate is 0454 thus her basal metabolic rate is worse than expected.  General: Cooperative, alert, well developed, in no acute distress. HEENT: Conjunctivae and lids unremarkable. Cardiovascular: Regular rhythm.  Lungs: Normal work of breathing. Neurologic: No focal deficits.   Lab Results  Component Value Date   CREATININE 0.78 03/16/2023   BUN 12 03/16/2023   NA 139 03/16/2023   K 4.3 03/16/2023   CL 102 03/16/2023   CO2 26 03/16/2023   Lab Results  Component Value Date   ALT 46 (H) 03/16/2023   AST 25 03/16/2023   ALKPHOS 92 02/26/2022   BILITOT 0.5 03/16/2023   Lab Results  Component Value Date   HGBA1C 5.9 10/28/2021   No results found for: "INSULIN" Lab Results  Component Value Date   TSH 0.89 03/16/2023   Lab Results  Component Value Date   CHOL 184 04/27/2022   HDL 63.50 04/27/2022   LDLCALC 112 (H) 04/27/2022   TRIG 42.0 04/27/2022   CHOLHDL 3 04/27/2022   Lab Results  Component Value Date   WBC 3.8 (L) 10/25/2022   HGB 14.0 10/25/2022   HCT 41.6 10/25/2022   MCV 88.1 10/25/2022   PLT 231.0 10/25/2022   No results found for: "IRON", "TIBC", "FERRITIN"  Attestation Statements:   Reviewed by clinician on day of visit: allergies, medications, problem list, medical history, surgical history, family history, social history, and previous encounter notes.  Time spent on visit including pre-visit chart review and post-visit charting and care was 45 minutes.   I, Burt Knack, am acting as transcriptionist for Reuben Likes, MD.  This is the patient's first visit at [facility]. The patient's NEW PATIENT PACKET was reviewed at length. Included in the  packet: current and past health history, medications, allergies, ROS, gynecologic history (women only), surgical history, family history, social history, weight history, weight loss surgery history (for those that have had weight loss surgery), nutritional evaluation, mood and food questionnaire, PHQ9, Epworth questionnaire, sleep habits questionnaire, patient life and health improvement goals questionnaire. These will all be scanned into the patient's chart under media.   During the visit, I independently reviewed the patient's EKG, bioimpedance scale results, and indirect calorimeter results. I used this information to tailor a meal plan for the patient that will help her to lose weight and will improve her  obesity-related conditions going forward. I performed a medically necessary appropriate examination and/or evaluation. I discussed the assessment and treatment plan with the patient. The patient was provided an opportunity to ask questions and all were answered. The patient agreed with the plan and demonstrated an understanding of the instructions. Labs were ordered at this visit and will be reviewed at the next visit unless more critical results need to be addressed immediately. Clinical information was updated and documented in the EMR.    I have reviewed the above documentation for accuracy and completeness, and I agree with the above. - Reuben Likes, MD

## 2023-03-24 LAB — CBC WITH DIFFERENTIAL/PLATELET
Basophils Absolute: 0 10*3/uL (ref 0.0–0.2)
Basos: 1 %
EOS (ABSOLUTE): 0.1 10*3/uL (ref 0.0–0.4)
Eos: 3 %
Hematocrit: 41.3 % (ref 34.0–46.6)
Hemoglobin: 13.8 g/dL (ref 11.1–15.9)
Immature Grans (Abs): 0 10*3/uL (ref 0.0–0.1)
Immature Granulocytes: 0 %
Lymphocytes Absolute: 1.8 10*3/uL (ref 0.7–3.1)
Lymphs: 42 %
MCH: 29.8 pg (ref 26.6–33.0)
MCHC: 33.4 g/dL (ref 31.5–35.7)
MCV: 89 fL (ref 79–97)
Monocytes Absolute: 0.3 10*3/uL (ref 0.1–0.9)
Monocytes: 8 %
Neutrophils Absolute: 2 10*3/uL (ref 1.4–7.0)
Neutrophils: 46 %
Platelets: 254 10*3/uL (ref 150–450)
RBC: 4.63 x10E6/uL (ref 3.77–5.28)
RDW: 13.3 % (ref 11.7–15.4)
WBC: 4.2 10*3/uL (ref 3.4–10.8)

## 2023-03-24 LAB — HEMOGLOBIN A1C
Est. average glucose Bld gHb Est-mCnc: 117 mg/dL
Hgb A1c MFr Bld: 5.7 % — ABNORMAL HIGH (ref 4.8–5.6)

## 2023-03-24 LAB — LIPID PANEL WITH LDL/HDL RATIO
Cholesterol, Total: 195 mg/dL (ref 100–199)
HDL: 62 mg/dL (ref >39)
LDL Chol Calc (NIH): 121 mg/dL — ABNORMAL HIGH (ref 0–99)
LDL/HDL Ratio: 2 ratio (ref 0.0–3.2)
Triglycerides: 64 mg/dL (ref 0–149)
VLDL Cholesterol Cal: 12 mg/dL (ref 5–40)

## 2023-03-24 LAB — FOLATE: Folate: 17.5 ng/mL (ref >3.0)

## 2023-03-24 LAB — T3: T3, Total: 126 ng/dL (ref 71–180)

## 2023-03-24 LAB — VITAMIN D 25 HYDROXY (VIT D DEFICIENCY, FRACTURES): Vit D, 25-Hydroxy: 20.8 ng/mL — ABNORMAL LOW (ref 30.0–100.0)

## 2023-03-24 LAB — T4, FREE: Free T4: 1.24 ng/dL (ref 0.82–1.77)

## 2023-03-24 LAB — VITAMIN B12: Vitamin B-12: 771 pg/mL (ref 232–1245)

## 2023-03-24 LAB — INSULIN, RANDOM: INSULIN: 12.5 u[IU]/mL (ref 2.6–24.9)

## 2023-03-28 ENCOUNTER — Ambulatory Visit (HOSPITAL_COMMUNITY)
Admission: RE | Admit: 2023-03-28 | Discharge: 2023-03-28 | Disposition: A | Discharge status: Home / Self Care | Payer: BC Managed Care – PPO | Source: Ambulatory Visit | Attending: Family Medicine | Admitting: Family Medicine

## 2023-03-28 DIAGNOSIS — M7989 Other specified soft tissue disorders: Secondary | ICD-10-CM | POA: Insufficient documentation

## 2023-04-03 ENCOUNTER — Ambulatory Visit (INDEPENDENT_AMBULATORY_CARE_PROVIDER_SITE_OTHER): Payer: BC Managed Care – PPO | Admitting: Family Medicine

## 2023-04-03 ENCOUNTER — Encounter (INDEPENDENT_AMBULATORY_CARE_PROVIDER_SITE_OTHER): Payer: Self-pay | Admitting: Family Medicine

## 2023-04-03 VITALS — BP 105/68 | HR 53 | Temp 98.4°F | Ht 62.0 in | Wt 192.0 lb

## 2023-04-03 DIAGNOSIS — E559 Vitamin D deficiency, unspecified: Secondary | ICD-10-CM | POA: Diagnosis not present

## 2023-04-03 DIAGNOSIS — E7849 Other hyperlipidemia: Secondary | ICD-10-CM | POA: Diagnosis not present

## 2023-04-03 DIAGNOSIS — E669 Obesity, unspecified: Secondary | ICD-10-CM

## 2023-04-03 DIAGNOSIS — R7303 Prediabetes: Secondary | ICD-10-CM

## 2023-04-03 DIAGNOSIS — I10 Essential (primary) hypertension: Secondary | ICD-10-CM | POA: Diagnosis not present

## 2023-04-03 DIAGNOSIS — Z6835 Body mass index (BMI) 35.0-35.9, adult: Secondary | ICD-10-CM

## 2023-04-03 MED ORDER — VITAMIN D (ERGOCALCIFEROL) 1.25 MG (50000 UNIT) PO CAPS: 50000.0000 [IU] | ORAL_CAPSULE | ORAL | 0 refills | Status: DC

## 2023-04-03 NOTE — Progress Notes (Signed)
Chief Complaint:   OBESITY Elen is here to discuss her progress with her obesity treatment plan along with follow-up of her obesity related diagnoses. Clorinda is on the Category 1 Plan and states she is following her eating plan approximately 95% of the time. Takarra states she is doing 0 minutes 0 times per week.  Today's visit was #: 2 Starting weight: 192 lbs Starting date: 03/20/2023 Today's weight: 192 lbs Today's date: 04/03/2023 Total lbs lost to date: 0 Total lbs lost since last in-office visit: 0  Interim History: Patient voices that the first few weeks were not as bad as she thought.  She is surprised she didn't lose anything.  She felt hungry mainly at night after dinner; mostly 3 hours after dinner.  She was able to get the full 6oz at dinner; she was getting about the same in terms of quantity that she was previously. She is doing naked popcorn for dinner, apple slices with caramel, pears or fruit for snack.  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  Subjective:   1. Other hyperlipidemia Patient is on statin and Zetia.  Her recent LDL was 121, HDL 62, and triglycerides 64.  Patient was referred to Cardiology.  2. Essential hypertension Patient's blood pressure is controlled today.  She denies chest pain, chest pressure, or headache.  She is on Zestoretic.  3. Vitamin D deficiency Patient is not on vitamin D.  She denies nausea, vomiting, or muscle weakness but notes fatigue.  4. Prediabetes Patient's recent A1c was 5.7 and insulin 12.5.  She is not on medications.  She notes some carbohydrate cravings for potatoes.  Assessment/Plan:   1. Other hyperlipidemia Patient will follow-up in December with lipid clinic for further management of cholesterol.  2. Essential hypertension Patient will continue her current medications with no change in dose.  3. Vitamin D deficiency Patient agreed to start  prescription vitamin D 50,000 IU once weekly with no refills.  - Vitamin D, Ergocalciferol, (DRISDOL) 1.25 MG (50000 UNIT) CAPS capsule; Take 1 capsule (50,000 Units total) by mouth every 7 (seven) days.  Dispense: 4 capsule; Refill: 0  4. Prediabetes Pathophysiology of insulin resistance, prediabetes, and diabetes mellitus were discussed with the patient today.  No medications at this time, and we will repeat labs in 3 months.  5. BMI 35.0-35.9,adult  6. Obesity, starting BMI 35.12 Jaleen is currently in the action stage of change. As such, her goal is to continue with weight loss efforts. She has agreed to the Category 1 Plan + 1 cup of vegetables, + 100 calorie snack.   Exercise goals: No exercise has been prescribed at this time.  Behavioral modification strategies: increasing lean protein intake, meal planning and cooking strategies, keeping healthy foods in the home, ways to avoid night time snacking, and planning for success.  Anyce has agreed to follow-up with our clinic in 2 to 3 weeks. She was informed of the importance of frequent follow-up visits to maximize her success with intensive lifestyle modifications for her multiple health conditions.   Objective:   Blood pressure 105/68, pulse (!) 53, temperature 98.4 F (36.9 C), height 5\' 2"  (1.575 m), weight 192 lb (87.1 kg), last menstrual period 02/03/2015, SpO2 99 %. Body mass index is 35.12 kg/m.  General: Cooperative, alert, well developed, in no acute distress. HEENT: Conjunctivae and lids unremarkable. Cardiovascular: Regular rhythm.  Lungs: Normal work of breathing. Neurologic:  No focal deficits.   Lab Results  Component Value Date   CREATININE 0.78 03/16/2023   BUN 12 03/16/2023   NA 139 03/16/2023   K 4.3 03/16/2023   CL 102 03/16/2023   CO2 26 03/16/2023   Lab Results  Component Value Date   ALT 46 (H) 03/16/2023   AST 25 03/16/2023   ALKPHOS 92 02/26/2022   BILITOT 0.5 03/16/2023   Lab Results   Component Value Date   HGBA1C 5.7 (H) 03/20/2023   HGBA1C 5.9 10/28/2021   Lab Results  Component Value Date   INSULIN 12.5 03/20/2023   Lab Results  Component Value Date   TSH 0.89 03/16/2023   Lab Results  Component Value Date   CHOL 195 03/20/2023   HDL 62 03/20/2023   LDLCALC 121 (H) 03/20/2023   TRIG 64 03/20/2023   CHOLHDL 3 04/27/2022   Lab Results  Component Value Date   VD25OH 20.8 (L) 03/20/2023   VD25OH 21.81 (L) 12/10/2020   Lab Results  Component Value Date   WBC 4.2 03/20/2023   HGB 13.8 03/20/2023   HCT 41.3 03/20/2023   MCV 89 03/20/2023   PLT 254 03/20/2023   No results found for: "IRON", "TIBC", "FERRITIN"  Attestation Statements:   Reviewed by clinician on day of visit: allergies, medications, problem list, medical history, surgical history, family history, social history, and previous encounter notes.  Time spent on visit including pre-visit chart review and post-visit care and charting was 45 minutes.   I, Burt Knack, am acting as transcriptionist for Reuben Likes, MD.  I have reviewed the above documentation for accuracy and completeness, and I agree with the above. - Reuben Likes, MD

## 2023-04-26 ENCOUNTER — Other Ambulatory Visit (INDEPENDENT_AMBULATORY_CARE_PROVIDER_SITE_OTHER): Payer: Self-pay | Admitting: Family Medicine

## 2023-04-26 DIAGNOSIS — E559 Vitamin D deficiency, unspecified: Secondary | ICD-10-CM

## 2023-05-02 ENCOUNTER — Ambulatory Visit (INDEPENDENT_AMBULATORY_CARE_PROVIDER_SITE_OTHER): Payer: BC Managed Care – PPO | Admitting: Family Medicine

## 2023-05-03 ENCOUNTER — Encounter (INDEPENDENT_AMBULATORY_CARE_PROVIDER_SITE_OTHER): Payer: Self-pay | Admitting: Family Medicine

## 2023-05-03 ENCOUNTER — Ambulatory Visit (INDEPENDENT_AMBULATORY_CARE_PROVIDER_SITE_OTHER): Payer: BC Managed Care – PPO | Admitting: Family Medicine

## 2023-05-03 VITALS — BP 125/83 | HR 54 | Temp 98.1°F | Ht 62.0 in | Wt 192.0 lb

## 2023-05-03 DIAGNOSIS — R7303 Prediabetes: Secondary | ICD-10-CM

## 2023-05-03 DIAGNOSIS — E559 Vitamin D deficiency, unspecified: Secondary | ICD-10-CM | POA: Diagnosis not present

## 2023-05-03 DIAGNOSIS — Z6835 Body mass index (BMI) 35.0-35.9, adult: Secondary | ICD-10-CM

## 2023-05-03 DIAGNOSIS — E669 Obesity, unspecified: Secondary | ICD-10-CM

## 2023-05-03 MED ORDER — VITAMIN D (ERGOCALCIFEROL) 1.25 MG (50000 UNIT) PO CAPS: 50000.0000 [IU] | ORAL_CAPSULE | ORAL | 0 refills | Status: DC

## 2023-05-03 NOTE — Progress Notes (Signed)
Crystal Shea, D.O.  ABFM, ABOM Specializing in Clinical Bariatric Medicine  Office located at: [address] [place], [place]  [address]     Assessment and Plan:   Medications Discontinued During This Encounter  Medication Reason   Vitamin D, Ergocalciferol, (DRISDOL) 1.25 MG (50000 UNIT) CAPS capsule Reorder     Meds ordered this encounter  Medications   Vitamin D, Ergocalciferol, (DRISDOL) 1.25 MG (50000 UNIT) CAPS capsule    Sig: Take 1 capsule (50,000 Units total) by mouth every 7 (seven) days.    Dispense:  4 capsule    Refill:  0     Prediabetes Assessment: Condition is being treated with diet/exercise. Hunger and cravings are pretty well controlled when following her prescribed meal plan.   Lab Results  Component Value Date   HGBA1C 5.7 (H) 03/20/2023   HGBA1C 5.9 10/28/2021   INSULIN 12.5 03/20/2023    Plan: Reminded patient that having protein with each meal is important for stabilizing sugars and an important part of  her prediabetes management as well as controlling hunger and cravings.  Crystal Shea will continue to work on weight loss, exercise, via their meal plan we devised to help decrease the risk of progressing to diabetes. We will recheck A1c and fasting insulin level in approximately 2-3 months from last check, or as deemed appropriate.    Vitamin D deficiency Assessment: This condition is being treated with Ergocalciferol 50K IU weekly and Virdia is responding to supplement well. She notices a decrease in joint pain and increase in energy levels since starting ERGO.   Lab Results  Component Value Date   VD25OH 20.8 (L) 03/20/2023   VD25OH 21.81 (L) 12/10/2020   Plan: Continue with meal plan, their weight loss efforts, and ERGO at current dose to further improve this condition. Will refill ERGO today. Will continue to monitor condition expectantly.    TREATMENT PLAN FOR OBESITY: BMI 35.0-35.9,adult - current BMI 35.11 Obesity, starting BMI  35.12 Assessment: Crystal Shea is here to discuss her progress with her obesity treatment plan along with follow-up of her obesity related diagnoses. See Medical Weight Management Flowsheet for complete bioelectrical impedance results.  Condition is not optimized.  Biometric data collected today, was reviewed with patient.   Since last office visit on 04/03/23 patient's  Muscle mass has decreased by 1 lb. Fat mass has increased by 1.2 lb. Total body water has not changed from prior  Counseling done on how various foods will affect these numbers and how to maximize success  Total lbs lost to date: 0 Total weight loss percentage to date: 0   Plan: Encouraged pt to meal prep and make foods in the crock pot.  Pt agrees to continue the Category 1 meal plan  Behavioral Intervention Additional resources provided today: Crock pot recipes handout Evidence-based interventions for health behavior change were utilized today including the discussion of self monitoring techniques, problem-solving barriers and SMART goal setting techniques.   Regarding patient's less desirable eating habits and patterns, we employed the technique of small changes.  Pt will specifically work on: not skipping meals, meal prepping/planning Saturday and Wednesday, and increasing lean protein intake for next visit.    Recommended Physical Activity Goals  Crystal Shea has been advised to slowly work up to 150 minutes of moderate intensity aerobic activity a week and strengthening exercises 2-3 times per week for cardiovascular health, weight loss maintenance and preservation of muscle mass.   She has agreed to Continue current  level of physical activity   FOLLOW UP: Return in 2-3 wks. She was informed of the importance of frequent follow up visits to maximize her success with intensive lifestyle modifications for her multiple health conditions.  Subjective:   Chief complaint: Obesity Crystal Shea is here to discuss her progress with  her obesity treatment plan. She is on the Category 1 Plan and states she is following her eating plan approximately 70% of the time. She states she is walking 30 minutes 2 days per week.  Interval History:  Crystal Shea is here for a follow up office visit. This is my first meeting Crystal Shea, her last 2 visits were with Dr.U. Since last OV, Crystal Shea endorses not following her meal plan as closely as she would like to because of "funerals, church, work stress, and family events". Reports sometimes skipping meals. Reports eating fruits for snacks.   Review of Systems:  Pertinent positives were addressed with patient today.  Reviewed by clinician on day of visit: allergies, medications, problem list, medical history, surgical history, family history, social history, and previous encounter notes.  Weight Summary and Biometrics   Weight Lost Since Last Visit: 0  Weight Gained Since Last Visit: 0    Vitals Temp: 98.1 F (36.7 C) BP: 125/83 Pulse Rate: (!) 54 SpO2: 99 %   Anthropometric Measurements Height: 5\' 2"  (1.575 m) Weight: 192 lb (87.1 kg) BMI (Calculated): 35.11 Weight at Last Visit: 192lb Weight Lost Since Last Visit: 0 Weight Gained Since Last Visit: 0 Starting Weight: 192 lb Total Weight Loss (lbs): 0 lb (0 kg) Peak Weight: 198 lb   Body Composition  Body Fat %: 44.2 % Fat Mass (lbs): 85.2 lbs Muscle Mass (lbs): 102 lbs Total Body Water (lbs): 71.8 lbs Visceral Fat Rating : 11   Other Clinical Data Fasting: no Labs: no Today's Visit #: 3 Starting Date: 03/20/23   Objective:   PHYSICAL EXAM: Blood pressure 125/83, pulse (!) 54, temperature 98.1 F (36.7 C), height 5\' 2"  (1.575 m), weight 192 lb (87.1 kg), last menstrual period 02/03/2015, SpO2 99%. Body mass index is 35.12 kg/m.  General: Well Developed, well nourished, and in no acute distress.  HEENT: Normocephalic, atraumatic Skin: Warm and dry, cap RF less 2 sec, good turgor Chest:  Normal  excursion, shape, no gross abn Respiratory: speaking in full sentences, no conversational dyspnea NeuroM-Sk: Ambulates w/o assistance, moves * 4 Psych: A and O *3, insight good, mood-full  DIAGNOSTIC DATA REVIEWED:  BMET    Component Value Date/Time   NA 139 03/16/2023 1540   NA 145 (H) 03/05/2020 1021   K 4.3 03/16/2023 1540   CL 102 03/16/2023 1540   CO2 26 03/16/2023 1540   GLUCOSE 78 03/16/2023 1540   BUN 12 03/16/2023 1540   BUN 15 03/05/2020 1021   CREATININE 0.78 03/16/2023 1540   CALCIUM 9.1 03/16/2023 1540   GFRNONAA >60 02/26/2022 0820   GFRAA 82 03/05/2020 1021   Lab Results  Component Value Date   HGBA1C 5.7 (H) 03/20/2023   HGBA1C 5.9 10/28/2021   Lab Results  Component Value Date   INSULIN 12.5 03/20/2023   Lab Results  Component Value Date   TSH 0.89 03/16/2023   CBC    Component Value Date/Time   WBC 4.2 03/20/2023 0913   WBC 3.8 (L) 10/25/2022 1105   RBC 4.63 03/20/2023 0913   RBC 4.72 10/25/2022 1105   HGB 13.8 03/20/2023 0913   HCT 41.3 03/20/2023 0913   PLT 254  03/20/2023 0913   MCV 89 03/20/2023 0913   MCH 29.8 03/20/2023 0913   MCH 29.6 02/26/2022 0820   MCHC 33.4 03/20/2023 0913   MCHC 33.6 10/25/2022 1105   RDW 13.3 03/20/2023 0913   Iron Studies No results found for: "IRON", "TIBC", "FERRITIN", "IRONPCTSAT" Lipid Panel     Component Value Date/Time   CHOL 195 03/20/2023 0913   TRIG 64 03/20/2023 0913   HDL 62 03/20/2023 0913   CHOLHDL 3 04/27/2022 1027   VLDL 8.4 04/27/2022 1027   LDLCALC 121 (H) 03/20/2023 0913   Hepatic Function Panel     Component Value Date/Time   PROT 7.2 03/16/2023 1540   PROT 7.5 03/05/2020 1021   ALBUMIN 4.4 02/26/2022 0820   ALBUMIN 3.9 03/05/2020 1021   AST 25 03/16/2023 1540   ALT 46 (H) 03/16/2023 1540   ALKPHOS 92 02/26/2022 0820   BILITOT 0.5 03/16/2023 1540   BILITOT 0.5 03/05/2020 1021      Component Value Date/Time   TSH 0.89 03/16/2023 1540   Nutritional Lab Results   Component Value Date   VD25OH 20.8 (L) 03/20/2023   VD25OH 21.81 (L) 12/10/2020    Attestations:   I, Crystal Shea , acting as a Stage manager for Crystal Lot, DO., have compiled all relevant documentation for today's office visit on behalf of Crystal Lot, DO, while in the presence of [employer], DO.  I have reviewed the above documentation for accuracy and completeness, and I agree with the above. Crystal Shea, D.O.  The 21st Century Cures Act was signed into law in 2016 which includes the topic of electronic health records.  This provides immediate access to information in MyChart.  This includes consultation notes, operative notes, office notes, lab results and pathology reports.  If you have any questions about what you read please let us know at your next visit so we can discuss your concerns and take corrective action if need be.  We are right here with you.

## 2023-05-23 ENCOUNTER — Telehealth (INDEPENDENT_AMBULATORY_CARE_PROVIDER_SITE_OTHER): Payer: BC Managed Care – PPO | Admitting: Family Medicine

## 2023-05-23 NOTE — Progress Notes (Signed)
TeleHealth Visit:  This visit was completed with telemedicine (audio/video) technology. Crystal Shea has verbally consented to this TeleHealth visit. The patient is located at home, the provider is located at home. The participants in this visit include the listed provider and patient. The visit was conducted today via phone call.  Unsuccessful attempt was made to connect to video. Length of call was 21 minutes.   OBESITY Crystal Shea is here to discuss her progress with her obesity treatment plan along with follow-up of her obesity related diagnoses.   Today's visit was # 4 Starting weight: 192 lbs Starting date: 03/20/23 Weight at last in office visit: 192 lbs on 05/03/23 Total weight loss: 0 lbs at last in office visit on 05/03/23. Today's reported weight (05/24/23): none reported  Nutrition Plan: the Category 1 plan with 2 cups vegetables  Current exercise:  walking 30 minutes 5 days per week   Interim History:  She skips lunch at work often. She is eating out less for lunch.  She does not eat fast food. She got tired of frozen meals for lunch. She doesn't cook much in the evenings. She meal prepped this past Sunday but gets tired of the food after 2 days. She and her daughter are walking 30 minutes 5 days week. She struggles with sweets cravings. She was successful with Wegovy in the past.  She is wondering why she is not losing weight.  Assessment/Plan:  1. Prediabetes Last A1c was 5.7.  Medication(s): None Polyphagia:Yes Lab Results  Component Value Date   HGBA1C 5.7 (H) 03/20/2023   HGBA1C 5.9 10/28/2021   Lab Results  Component Value Date   INSULIN 12.5 03/20/2023    Plan: Start Metformin 500 mg once daily breakfast May take 1/2 pill if she experiences GI upset/loose stools.   2. Vitamin D Deficiency Vitamin D is not at goal of 50.  Most recent vitamin D level was 20.Marland Kitchen She is on  prescription ergocalciferol 50,000 IU weekly. Lab Results  Component Value Date    VD25OH 20.8 (L) 03/20/2023   VD25OH 21.81 (L) 12/10/2020    Plan: Continue and refill  prescription ergocalciferol 50,000 IU weekly   3. Generalized Obesity: Current BMI 35  Crystal Shea is currently in the action stage of change. As such, her goal is to continue with weight loss efforts.  She has agreed to the Category 1 plan with 2 cups of vegetables at dinner.  1.  Encouraged her to stop eating and at lunch.  Discussed options that she can take to work such as packaged salad with extra protein. 2.  Dining out guide sent via MyChart. 3.  Continue to meal plan on Sunday for least 2 meals during the week.  Exercise goals:  as is  Behavioral modification strategies: increasing lean protein intake, decreasing simple carbohydrates , decrease eating out, meal planning , and planning for success.  Crystal Shea has agreed to follow-up with our clinic in 3 weeks.  No orders of the defined types were placed in this encounter.   Medications Discontinued During This Encounter  Medication Reason   Vitamin D, Ergocalciferol, (DRISDOL) 1.25 MG (50000 UNIT) CAPS capsule Reorder     Meds ordered this encounter  Medications   Vitamin D, Ergocalciferol, (DRISDOL) 1.25 MG (50000 UNIT) CAPS capsule    Sig: Take 1 capsule (50,000 Units total) by mouth every 7 (seven) days.    Dispense:  4 capsule    Refill:  0    Order Specific Question:   Supervising Provider  Answer:   Glennis Brink [[id number]]   metFORMIN (GLUCOPHAGE) 500 MG tablet    Sig: Take 1 tablet (500 mg total) by mouth daily with breakfast.    Dispense:  30 tablet    Refill:  0    Order Specific Question:   Supervising Provider    Answer:   Glennis Brink [[id number]]      Objective:   VITALS: Per patient if applicable, see vitals. GENERAL: Alert and in no acute distress. CARDIOPULMONARY: No increased WOB. Speaking in clear sentences.  PSYCH: Pleasant and cooperative. Speech normal rate and rhythm. Affect is appropriate. Insight and  judgement are appropriate. Attention is focused, linear, and appropriate.  NEURO: Oriented as arrived to appointment on time with no prompting.   Attestation Statements:   Reviewed by clinician on day of visit: allergies, medications, problem list, medical history, surgical history, family history, social history, and previous encounter notes.   This was prepared with the assistance of Engineer, civil (consulting).  Occasional wrong-word or sound-a-like substitutions may have occurred due to the inherent limitations of voice recognition software.

## 2023-05-24 ENCOUNTER — Encounter (INDEPENDENT_AMBULATORY_CARE_PROVIDER_SITE_OTHER): Payer: Self-pay | Admitting: Family Medicine

## 2023-05-24 ENCOUNTER — Telehealth (INDEPENDENT_AMBULATORY_CARE_PROVIDER_SITE_OTHER): Payer: BC Managed Care – PPO | Admitting: Family Medicine

## 2023-05-24 ENCOUNTER — Ambulatory Visit (INDEPENDENT_AMBULATORY_CARE_PROVIDER_SITE_OTHER): Payer: BC Managed Care – PPO | Admitting: Family Medicine

## 2023-05-24 DIAGNOSIS — R7303 Prediabetes: Secondary | ICD-10-CM | POA: Diagnosis not present

## 2023-05-24 DIAGNOSIS — E559 Vitamin D deficiency, unspecified: Secondary | ICD-10-CM | POA: Diagnosis not present

## 2023-05-24 DIAGNOSIS — E669 Obesity, unspecified: Secondary | ICD-10-CM

## 2023-05-24 DIAGNOSIS — Z6835 Body mass index (BMI) 35.0-35.9, adult: Secondary | ICD-10-CM

## 2023-05-24 MED ORDER — VITAMIN D (ERGOCALCIFEROL) 1.25 MG (50000 UNIT) PO CAPS: 50000.0000 [IU] | ORAL_CAPSULE | ORAL | 0 refills | Status: DC

## 2023-05-24 MED ORDER — METFORMIN HCL 500 MG PO TABS: 500.0000 mg | ORAL_TABLET | Freq: Every day | ORAL | 0 refills | Status: DC

## 2023-05-30 ENCOUNTER — Other Ambulatory Visit: Payer: Self-pay | Admitting: Family Medicine

## 2023-05-30 DIAGNOSIS — I10 Essential (primary) hypertension: Secondary | ICD-10-CM

## 2023-05-30 DIAGNOSIS — E7800 Pure hypercholesterolemia, unspecified: Secondary | ICD-10-CM

## 2023-05-31 ENCOUNTER — Encounter (INDEPENDENT_AMBULATORY_CARE_PROVIDER_SITE_OTHER): Payer: Self-pay

## 2023-06-06 ENCOUNTER — Other Ambulatory Visit (INDEPENDENT_AMBULATORY_CARE_PROVIDER_SITE_OTHER): Payer: Self-pay | Admitting: Family Medicine

## 2023-06-06 DIAGNOSIS — R7303 Prediabetes: Secondary | ICD-10-CM

## 2023-06-08 ENCOUNTER — Encounter (INDEPENDENT_AMBULATORY_CARE_PROVIDER_SITE_OTHER): Payer: Self-pay | Admitting: Family Medicine

## 2023-06-11 ENCOUNTER — Ambulatory Visit (INDEPENDENT_AMBULATORY_CARE_PROVIDER_SITE_OTHER): Payer: BC Managed Care – PPO | Admitting: Family Medicine

## 2023-06-11 VITALS — BP 121/76 | HR 57 | Temp 98.2°F | Ht 62.0 in | Wt 193.0 lb

## 2023-06-11 DIAGNOSIS — R7303 Prediabetes: Secondary | ICD-10-CM

## 2023-06-11 DIAGNOSIS — Z6835 Body mass index (BMI) 35.0-35.9, adult: Secondary | ICD-10-CM

## 2023-06-11 DIAGNOSIS — E559 Vitamin D deficiency, unspecified: Secondary | ICD-10-CM

## 2023-06-11 DIAGNOSIS — F5089 Other specified eating disorder: Secondary | ICD-10-CM

## 2023-06-11 DIAGNOSIS — E669 Obesity, unspecified: Secondary | ICD-10-CM

## 2023-06-11 MED ORDER — METFORMIN HCL 500 MG PO TABS
500.0000 mg | ORAL_TABLET | Freq: Every day | ORAL | 0 refills | Status: DC
Start: 2023-06-11 — End: 2023-07-11

## 2023-06-11 MED ORDER — TOPIRAMATE 25 MG PO TABS
ORAL_TABLET | ORAL | 0 refills | Status: DC
Start: 2023-06-11 — End: 2023-07-11

## 2023-06-11 MED ORDER — VITAMIN D (ERGOCALCIFEROL) 1.25 MG (50000 UNIT) PO CAPS
50000.0000 [IU] | ORAL_CAPSULE | ORAL | 0 refills | Status: DC
Start: 2023-06-11 — End: 2023-07-11

## 2023-06-11 MED ORDER — LOMAIRA 8 MG PO TABS
ORAL_TABLET | ORAL | 0 refills | Status: DC
Start: 2023-06-11 — End: 2023-07-11

## 2023-06-11 NOTE — Progress Notes (Signed)
Chief Complaint:   OBESITY Crystal Shea is here to discuss her progress with her obesity treatment plan along with follow-up of her obesity related diagnoses. Crystal Shea is on the Category 1 Plan with 2 cups of vegetables and states she is following her eating plan approximately 50% of the time. Crystal Shea states she is walking and doing jumping jacks 5 times per week.    Today's visit was #: 5 Starting weight: 192 lbs Starting date: 03/20/2023 Today's weight: 193 lbs Today's date: 06/11/2023 Total lbs lost to date: 0 Total lbs lost since last in-office visit: 0  Interim History: Patient has been trying to stick with meal plan as much as she can.  She is wondering about medication options to help with weight loss.    Subjective:   1. Prediabetes Patient is on metformin daily with no GI side effects.  2. Vitamin D deficiency Patient is on prescription vitamin D.  She denies nausea, vomiting, or muscle weakness but notes fatigue.  3. Other disorder of eating PDMP was checked today with no concerns.  Patient starting weight is of 193.4 (needs to lose 9.5 pounds by mid December 2024).  Controlled substance contract was signed by the patient.  Assessment/Plan:   1. Prediabetes Patient will continue metformin 500 mg once daily, and we will refill for 1 month.  - metFORMIN (GLUCOPHAGE) 500 MG tablet; Take 1 tablet (500 mg total) by mouth daily with breakfast.  Dispense: 30 tablet; Refill: 0  2. Vitamin D deficiency Patient will continue prescription vitamin D once weekly, we will refill for 1 month.  - Vitamin D, Ergocalciferol, (DRISDOL) 1.25 MG (50000 UNIT) CAPS capsule; Take 1 capsule (50,000 Units total) by mouth every 7 (seven) days.  Dispense: 4 capsule; Refill: 0  3. Other disorder of eating Patient agreed to start Lomaira 4 mg and start topiramate 25 mg with no refills.  - Phentermine HCl (LOMAIRA) 8 MG TABS; Take 0.5 tablets (4 mg total) by mouth daily for 14 days, THEN 1 tablet (8  mg total) daily for 14 days.  Dispense: 23 tablet; Refill: 0 - topiramate (TOPAMAX) 25 MG tablet; Take 1 tablet (25 mg total) by mouth daily for 14 days, THEN 2 tablets (50 mg total) daily for 14 days.  Dispense: 45 tablet; Refill: 0  4. BMI 35.0-35.9,adult  5. Obesity, starting BMI 35.12 Crystal Shea is currently in the action stage of change. As such, her goal is to continue with weight loss efforts. She has agreed to the Category 1 Plan with 2 cups of vegetables at dinner.   Exercise goals: All adults should avoid inactivity. Some physical activity is better than none, and adults who participate in any amount of physical activity gain some health benefits.  Behavioral modification strategies: increasing lean protein intake, meal planning and cooking strategies, keeping healthy foods in the home, and planning for success.  Crystal Shea has agreed to follow-up with our clinic in 3 to 4 weeks. She was informed of the importance of frequent follow-up visits to maximize her success with intensive lifestyle modifications for her multiple health conditions.   Objective:   Blood pressure 121/76, pulse (!) 57, temperature 98.2 F (36.8 C), height 5\' 2"  (1.575 m), weight 193 lb (87.5 kg), last menstrual period 02/03/2015, SpO2 100%. Body mass index is 35.3 kg/m.  General: Cooperative, alert, well developed, in no acute distress. HEENT: Conjunctivae and lids unremarkable. Cardiovascular: Regular rhythm.  Lungs: Normal work of breathing. Neurologic: No focal deficits.   Lab Results  Component Value Date   CREATININE 0.78 03/16/2023   BUN 12 03/16/2023   NA 139 03/16/2023   K 4.3 03/16/2023   CL 102 03/16/2023   CO2 26 03/16/2023   Lab Results  Component Value Date   ALT 46 (H) 03/16/2023   AST 25 03/16/2023   ALKPHOS 92 02/26/2022   BILITOT 0.5 03/16/2023   Lab Results  Component Value Date   HGBA1C 5.7 (H) 03/20/2023   HGBA1C 5.9 10/28/2021   Lab Results  Component Value Date   INSULIN  12.5 03/20/2023   Lab Results  Component Value Date   TSH 0.89 03/16/2023   Lab Results  Component Value Date   CHOL 195 03/20/2023   HDL 62 03/20/2023   LDLCALC 121 (H) 03/20/2023   TRIG 64 03/20/2023   CHOLHDL 3 04/27/2022   Lab Results  Component Value Date   VD25OH 20.8 (L) 03/20/2023   VD25OH 21.81 (L) 12/10/2020   Lab Results  Component Value Date   WBC 4.2 03/20/2023   HGB 13.8 03/20/2023   HCT 41.3 03/20/2023   MCV 89 03/20/2023   PLT 254 03/20/2023   No results found for: "IRON", "TIBC", "FERRITIN"  Attestation Statements:   Reviewed by clinician on day of visit: allergies, medications, problem list, medical history, surgical history, family history, social history, and previous encounter notes.   I, Burt Knack, am acting as transcriptionist for Reuben Likes, MD.  I have reviewed the above documentation for accuracy and completeness, and I agree with the above. - Reuben Likes, MD

## 2023-06-20 ENCOUNTER — Ambulatory Visit: Payer: BC Managed Care – PPO | Admitting: Family Medicine

## 2023-06-21 ENCOUNTER — Telehealth: Payer: Self-pay | Admitting: Family Medicine

## 2023-06-21 NOTE — Telephone Encounter (Signed)
 1st no show, letter sent via mail

## 2023-06-21 NOTE — Telephone Encounter (Signed)
9.4.24 no show/letter sent

## 2023-07-01 ENCOUNTER — Other Ambulatory Visit (INDEPENDENT_AMBULATORY_CARE_PROVIDER_SITE_OTHER): Payer: Self-pay | Admitting: Family Medicine

## 2023-07-01 DIAGNOSIS — F5089 Other specified eating disorder: Secondary | ICD-10-CM

## 2023-07-04 ENCOUNTER — Other Ambulatory Visit (INDEPENDENT_AMBULATORY_CARE_PROVIDER_SITE_OTHER): Payer: Self-pay | Admitting: Family Medicine

## 2023-07-04 DIAGNOSIS — F5089 Other specified eating disorder: Secondary | ICD-10-CM

## 2023-07-06 ENCOUNTER — Other Ambulatory Visit (INDEPENDENT_AMBULATORY_CARE_PROVIDER_SITE_OTHER): Payer: Self-pay | Admitting: Family Medicine

## 2023-07-06 DIAGNOSIS — E559 Vitamin D deficiency, unspecified: Secondary | ICD-10-CM

## 2023-07-11 ENCOUNTER — Ambulatory Visit (INDEPENDENT_AMBULATORY_CARE_PROVIDER_SITE_OTHER): Payer: BC Managed Care – PPO | Admitting: Family Medicine

## 2023-07-11 ENCOUNTER — Encounter (INDEPENDENT_AMBULATORY_CARE_PROVIDER_SITE_OTHER): Payer: Self-pay | Admitting: Family Medicine

## 2023-07-11 VITALS — BP 100/61 | HR 61 | Temp 98.2°F | Ht 62.0 in | Wt 188.0 lb

## 2023-07-11 DIAGNOSIS — F5089 Other specified eating disorder: Secondary | ICD-10-CM

## 2023-07-11 DIAGNOSIS — E669 Obesity, unspecified: Secondary | ICD-10-CM

## 2023-07-11 DIAGNOSIS — Z6834 Body mass index (BMI) 34.0-34.9, adult: Secondary | ICD-10-CM | POA: Diagnosis not present

## 2023-07-11 DIAGNOSIS — E559 Vitamin D deficiency, unspecified: Secondary | ICD-10-CM | POA: Diagnosis not present

## 2023-07-11 MED ORDER — TOPIRAMATE 50 MG PO TABS
50.0000 mg | ORAL_TABLET | Freq: Every day | ORAL | 0 refills | Status: DC
Start: 2023-07-11 — End: 2023-08-08

## 2023-07-11 MED ORDER — VITAMIN D (ERGOCALCIFEROL) 1.25 MG (50000 UNIT) PO CAPS
50000.0000 [IU] | ORAL_CAPSULE | ORAL | 0 refills | Status: DC
Start: 2023-07-11 — End: 2023-08-08

## 2023-07-11 MED ORDER — LOMAIRA 8 MG PO TABS
8.0000 mg | ORAL_TABLET | Freq: Every day | ORAL | 0 refills | Status: DC
Start: 2023-07-11 — End: 2023-08-08

## 2023-07-11 NOTE — Progress Notes (Signed)
Chief Complaint:   OBESITY Crystal Shea is here to discuss her progress with her obesity treatment plan along with follow-up of her obesity related diagnoses. Crystal Shea is on the Category 1 Plan with veg and states she is following her eating plan approximately 50% of the time. Crystal Shea states she is walking for 15-20 minutes 3-4 times per week.   Today's visit was #: 6 Starting weight: 192 lbs Starting date: 03/20/2023 Today's weight: 188 lbs Today's date: 07/11/2023 Total lbs lost to date: 4 Total lbs lost since last in-office visit: 5  Interim History: Patient presents for follow up.  She went to the beach over Labor Day weekend; first time she got away in a long time.  Meal plan wise she hasn't been getting in as much as she can over the last few weeks.  She isn't able to get all the food in that she is supposed to be eating. She is not snacking a lot so she is eating less than she was.  Next few weeks she is planning to work and has a few church activities.   Subjective:   1. Vitamin D deficiency Patient denies nausea, vomiting, or muscle weakness but notes fatigue.  She is on prescription vitamin D.  2. Other disorder of eating Patient is on combination Lomaira and topiramate.  Her starting weight was of 193 pounds.  PDMP was checked with no concerns, and her blood pressure is well-controlled.  Assessment/Plan:   1. Vitamin D deficiency We will refill vitamin D 50,000 IU once weekly for 1 month.  - Vitamin D, Ergocalciferol, (DRISDOL) 1.25 MG (50000 UNIT) CAPS capsule; Take 1 capsule (50,000 Units total) by mouth every 7 (seven) days.  Dispense: 4 capsule; Refill: 0  2. Other disorder of eating Patient will continue her medications, and we will refill Topamax 50 mg and Lomaira 8 mg for 1 month.  - topiramate (TOPAMAX) 50 MG tablet; Take 1 tablet (50 mg total) by mouth daily.  Dispense: 30 tablet; Refill: 0 - Phentermine HCl (LOMAIRA) 8 MG TABS; Take 1 tablet (8 mg total) by mouth  daily.  Dispense: 30 tablet; Refill: 0  3. BMI 34.0-34.9,adult  4. Obesity, starting BMI 35.12 Crystal Shea is currently in the action stage of change. As such, her goal is to continue with weight loss efforts. She has agreed to the Category 1 Plan with 2 cups of vegetables at dinner.   Exercise goals: All adults should avoid inactivity. Some physical activity is better than none, and adults who participate in any amount of physical activity gain some health benefits.  Behavioral modification strategies: increasing lean protein intake, meal planning and cooking strategies, keeping healthy foods in the home, and planning for success.  Crystal Shea has agreed to follow-up with our clinic in 4 weeks. She was informed of the importance of frequent follow-up visits to maximize her success with intensive lifestyle modifications for her multiple health conditions.   Objective:   Blood pressure 100/61, pulse 61, temperature 98.2 F (36.8 C), height 5\' 2"  (1.575 m), weight 188 lb (85.3 kg), last menstrual period 02/03/2015, SpO2 100%. Body mass index is 34.39 kg/m.  General: Cooperative, alert, well developed, in no acute distress. HEENT: Conjunctivae and lids unremarkable. Cardiovascular: Regular rhythm.  Lungs: Normal work of breathing. Neurologic: No focal deficits.   Lab Results  Component Value Date   CREATININE 0.78 03/16/2023   BUN 12 03/16/2023   NA 139 03/16/2023   K 4.3 03/16/2023   CL 102 03/16/2023  CO2 26 03/16/2023   Lab Results  Component Value Date   ALT 46 (H) 03/16/2023   AST 25 03/16/2023   ALKPHOS 92 02/26/2022   BILITOT 0.5 03/16/2023   Lab Results  Component Value Date   HGBA1C 5.7 (H) 03/20/2023   HGBA1C 5.9 10/28/2021   Lab Results  Component Value Date   INSULIN 12.5 03/20/2023   Lab Results  Component Value Date   TSH 0.89 03/16/2023   Lab Results  Component Value Date   CHOL 195 03/20/2023   HDL 62 03/20/2023   LDLCALC 121 (H) 03/20/2023   TRIG 64  03/20/2023   CHOLHDL 3 04/27/2022   Lab Results  Component Value Date   VD25OH 20.8 (L) 03/20/2023   VD25OH 21.81 (L) 12/10/2020   Lab Results  Component Value Date   WBC 4.2 03/20/2023   HGB 13.8 03/20/2023   HCT 41.3 03/20/2023   MCV 89 03/20/2023   PLT 254 03/20/2023   No results found for: "IRON", "TIBC", "FERRITIN"  Attestation Statements:   Reviewed by clinician on day of visit: allergies, medications, problem list, medical history, surgical history, family history, social history, and previous encounter notes.   I, Burt Knack, am acting as transcriptionist for Reuben Likes, MD.  I have reviewed the above documentation for accuracy and completeness, and I agree with the above. - Reuben Likes, MD

## 2023-07-13 ENCOUNTER — Ambulatory Visit (HOSPITAL_BASED_OUTPATIENT_CLINIC_OR_DEPARTMENT_OTHER): Payer: BC Managed Care – PPO | Admitting: Internal Medicine

## 2023-07-13 ENCOUNTER — Encounter (HOSPITAL_BASED_OUTPATIENT_CLINIC_OR_DEPARTMENT_OTHER): Payer: Self-pay | Admitting: Internal Medicine

## 2023-07-13 VITALS — BP 108/60 | HR 74 | Ht 63.0 in | Wt 193.0 lb

## 2023-07-13 DIAGNOSIS — E785 Hyperlipidemia, unspecified: Secondary | ICD-10-CM

## 2023-07-13 DIAGNOSIS — I700 Atherosclerosis of aorta: Secondary | ICD-10-CM

## 2023-07-13 NOTE — Progress Notes (Signed)
LIPID CLINIC CONSULT NOTE  Chief Complaint:  Manage dyslipidemia  Primary Care Physician: Crystal Mast, MD  Primary Cardiologist:  None  HPI:  Crystal Shea is a 52 y.o. female who is being seen today for the evaluation of dyslipidemia at the request of Crystal Reusing, MD. this a pleasant 51 year old female kindly referred for evaluation management of dyslipidemia.  She has had recent lipid showed total cholesterol 195, triglycerides 64, HDL 62 and LDL 121.  Previously however her cholesterols been much higher and she is currently on atorvastatin 40 mg and ezetimibe 10 mg daily.  Ezetimibe was added by her cardiologist Dr. Jacinto Shea back in 2022 but she did not follow-up with him and he did not suggest it.  She did have a CT of her abdomen back in May 2023 that showed some aortic atherosclerosis therefore there is evidence of ASCVD.  There is also strong family history of heart disease.  Her target LDL may be less than 100 but likely is even lower, possibly less than 70.  She has no known coronary artery disease.  PMHx:  Past Medical History:  Diagnosis Date   Allergic    Allergic rhinitis    Aortic atherosclerosis (HCC)    Back pain    CHF (congestive heart failure) (HCC)    Constipation    Depression    Diverticulitis    Diverticulosis    Essential hypertension 12/14/2018   Gallstones    Hepatic hemangioma    Hyperlipidemia    Lactose intolerance    Leg swelling    Mixed hyperlipidemia    Multiple thyroid nodules    Obesity    Prediabetes    Sickle cell trait (HCC)    SOB (shortness of breath)    Uterine fibroid     Past Surgical History:  Procedure Laterality Date   CESAREAN SECTION     x2   CHOLECYSTECTOMY, LAPAROSCOPIC     COLONOSCOPY     OPEN REDUCTION INTERNAL FIXATION (ORIF) METACARPAL Left    5th   TUBAL LIGATION      FAMHx:  Family History  Problem Relation Age of Onset   Hyperlipidemia Mother    Heart disease Mother    Hypertension  Mother    Diabetes Mother    Heart attack Mother 1   Obesity Mother    Obesity Father    Heart disease Father    Hyperlipidemia Father    Hypertension Father    COPD Father    Diabetes Father    Sleep apnea Father    Diabetes Sister    Heart disease Sister    Stroke Sister    Kidney cancer Sister    Heart disease Sister    Heart failure Sister    Heart attack Sister 53   Kidney cancer Sister    Heart disease Sister    Heart failure Sister    Seizures Sister    Stroke Maternal Grandmother    Diabetes Maternal Grandmother    Diabetes Maternal Grandfather    Heart disease Paternal Grandmother    Seizures Daughter    Asthma Son    Colon cancer Neg Hx    Stomach cancer Neg Hx    Rectal cancer Neg Hx     SOCHx:   reports that she has never smoked. She has never used smokeless tobacco. She reports current alcohol use. She reports that she does not use drugs.  ALLERGIES:  Allergies  Allergen Reactions   Anesthesia  S-I-40 [Propofol]     ROS: Pertinent items noted in HPI and remainder of comprehensive ROS otherwise negative.  HOME MEDS: Current Outpatient Medications on File Prior to Visit  Medication Sig Dispense Refill   atorvastatin (LIPITOR) 40 MG tablet TAKE 1 TABLET BY MOUTH EVERY DAY 90 tablet 3   cetirizine (ZYRTEC) 10 MG tablet Take 10-20 mg by mouth daily as needed for allergies.     cyclobenzaprine (FLEXERIL) 5 MG tablet Take by mouth.     ezetimibe (ZETIA) 10 MG tablet TAKE 1 TABLET (10 MG TOTAL) BY MOUTH DAILY AFTER SUPPER. 90 tablet 3   fluticasone (FLONASE) 50 MCG/ACT nasal spray Place into both nostrils daily.     lisinopril-hydrochlorothiazide (ZESTORETIC) 20-12.5 MG tablet TAKE 1 TABLET BY MOUTH EVERY DAY 90 tablet 3   Olopatadine HCl 0.2 % SOLN Pataday 0.2 % eye drops  PLACE ONE DROP INTO BOTH EYES DAILY. (Patient taking differently: Place 1 drop into both eyes daily as needed (allergies).) 2.5 mL 11   Phentermine HCl (LOMAIRA) 8 MG TABS Take 1 tablet  (8 mg total) by mouth daily. 30 tablet 0   Tetrahydrozoline HCl (VISINE OP) Place 1 drop into both eyes daily. For redness     topiramate (TOPAMAX) 50 MG tablet Take 1 tablet (50 mg total) by mouth daily. 30 tablet 0   Vitamin D, Ergocalciferol, (DRISDOL) 1.25 MG (50000 UNIT) CAPS capsule Take 1 capsule (50,000 Units total) by mouth every 7 (seven) days. 4 capsule 0   VITAMIN D, CHOLECALCIFEROL, PO Take by mouth.     No current facility-administered medications on file prior to visit.    LABS/IMAGING: No results found for this or any previous visit (from the past 48 hour(s)). No results found.  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   Crystal Shea is a possible familial hyperlipidemia with untreated LDL greater than 190.  Even on therapy her LDL is still 121 with high-dose statin and ezetimibe.  This is less than expected response to her therapy and could suggest she has an elevated LP(a).  Will go ahead and assess that.  Also like to get a calcium score to further risk stratify her.  She may be a candidate to add additional therapy such as a PCSK9 inhibitor to reach a goal LDL less than 70.  Will discuss this with her further once we have more data.  She is agreeable to this approach.  Thanks again for the kind referral.  Plan follow-up with me  in 4 months or sooner as necessary.  Crystal Nose, MD, [facility], FACP  [place]  [facility]  Medical Director of the Advanced Lipid Disorders &  Cardiovascular Risk Reduction Clinic Diplomate of  the [employer] of Clinical Lipidology Attending Cardiologist  Direct Dial: [phone]  Fax: [phone]  Website:  [email or web address] Nicely Tremaine Earwood 07/13/2023, 5:01 PM

## 2023-07-13 NOTE — Patient Instructions (Signed)
Medication Instructions:  NO CHANGES -- will depend on LPa and calcium score  *If you need a refill on your cardiac medications before your next appointment, please call your pharmacy*   Lab Work: LPa today   If you have labs (blood work) drawn today and your tests are completely normal, you will receive your results only by: MyChart Message (if you have MyChart) OR A paper copy in the mail If you have any lab test that is abnormal or we need to change your treatment, we will call you to review the results.   Testing/Procedures: Dr. Rennis Golden has ordered a CT coronary calcium score.   Test locations:  [facility]  [facility]  Imaging at [facility]  This is $99 out of pocket.   Coronary CalciumScan A coronary calcium scan is an imaging test used to look for deposits of calcium and other fatty materials (plaques) in the inner lining of the blood vessels of the heart (coronary arteries). These deposits of calcium and plaques can partly clog and narrow the coronary arteries without producing any symptoms or warning signs. This puts a person at risk for a heart attack. This test can detect these deposits before symptoms develop. Tell a health care provider about: Any allergies you have. All medicines you are taking, including vitamins, herbs, eye drops, creams, and over-the-counter medicines. Any problems you or family members have had with anesthetic medicines. Any blood disorders you have. Any surgeries you have had. Any medical conditions you have. Whether you are pregnant or may be pregnant. What are the risks? Generally, this is a safe procedure. However, problems may occur, including: Harm to a pregnant woman and her unborn baby. This test involves the use of radiation. Radiation exposure can be dangerous to a pregnant woman and her unborn baby. If you are pregnant, you generally should not have this procedure  done. Slight increase in the risk of cancer. This is because of the radiation involved in the test. What happens before the procedure? No preparation is needed for this procedure. What happens during the procedure? You will undress and remove any jewelry around your neck or chest. You will put on a hospital gown. Sticky electrodes will be placed on your chest. The electrodes will be connected to an electrocardiogram (ECG) machine to record a tracing of the electrical activity of your heart. A CT scanner will take pictures of your heart. During this time, you will be asked to lie still and hold your breath for 2-3 seconds while a picture of your heart is being taken. The procedure may vary among health care providers and hospitals. What happens after the procedure? You can get dressed. You can return to your normal activities. It is up to you to get the results of your test. Ask your health care provider, or the department that is doing the test, when your results will be ready. Summary A coronary calcium scan is an imaging test used to look for deposits of calcium and other fatty materials (plaques) in the inner lining of the blood vessels of the heart (coronary arteries). Generally, this is a safe procedure. Tell your health care provider if you are pregnant or may be pregnant. No preparation is needed for this procedure. A CT scanner will take pictures of your heart. You can return to your normal activities after the scan is done. This information is not intended to replace advice given to you by your health care  provider. Make sure you discuss any questions you have with your health care provider. Document Released: 03/30/2008 Document Revised: 08/21/2016 Document Reviewed: 08/21/2016 Elsevier Interactive Patient Education  2017 [employer].    Follow-Up: At [facility], you and your health needs are our priority.  As part of our continuing mission to provide you with  exceptional heart care, we have created designated Provider Care Teams.  These Care Teams include your primary Cardiologist (physician) and Advanced Practice Providers (APPs -  Physician Assistants and Nurse Practitioners) who all work together to provide you with the care you need, when you need it.  We recommend signing up for the patient portal called "MyChart".  Sign up information is provided on this After Visit Summary.  MyChart is used to connect with patients for Virtual Visits (Telemedicine).  Patients are able to view lab/test results, encounter notes, upcoming appointments, etc.  Non-urgent messages can be sent to your provider as well.   To learn more about what you can do with MyChart, go to [email or web address].    Your next appointment:   4 months with Dr. Rennis Golden

## 2023-07-15 LAB — LIPOPROTEIN A (LPA): Lipoprotein (a): 139.7 nmol/L — ABNORMAL HIGH (ref <75.0)

## 2023-07-23 ENCOUNTER — Ambulatory Visit (HOSPITAL_COMMUNITY)
Admission: RE | Admit: 2023-07-23 | Discharge: 2023-07-23 | Disposition: A | Discharge status: Home / Self Care | Payer: BC Managed Care – PPO | Source: Ambulatory Visit | Attending: Internal Medicine | Admitting: Internal Medicine

## 2023-07-23 DIAGNOSIS — E785 Hyperlipidemia, unspecified: Secondary | ICD-10-CM | POA: Insufficient documentation

## 2023-07-30 ENCOUNTER — Other Ambulatory Visit (HOSPITAL_COMMUNITY): Payer: Self-pay

## 2023-07-30 ENCOUNTER — Telehealth (HOSPITAL_BASED_OUTPATIENT_CLINIC_OR_DEPARTMENT_OTHER): Payer: Self-pay | Admitting: Pharmacy Technician

## 2023-07-30 ENCOUNTER — Other Ambulatory Visit (HOSPITAL_COMMUNITY): Payer: Self-pay | Admitting: *Deleted

## 2023-07-30 DIAGNOSIS — R911 Solitary pulmonary nodule: Secondary | ICD-10-CM

## 2023-07-30 NOTE — Telephone Encounter (Signed)
-----   Message from Nurse Eileen Stanford E sent at 07/30/2023  8:30 AM EDT ----- Regarding: PA for Repatha 140 or Praluent 150 Hey team   This patient needs a PA for Repatha 140 or Praluent 150  Dx: aortic atherosclerosis, possible FH, dyslipidemia with LDL goal less than 70 (above goal on statin + zetia)  Thanks

## 2023-07-30 NOTE — Telephone Encounter (Signed)
Pharmacy Patient Advocate Encounter   Received notification from  staff messages  that prior authorization for repatha is required/requested.   Insurance verification completed.   The patient is insured through [employer] [facility] .   Per test claim: PA required; PA submitted to [employer] [facility] via CoverMyMeds Key/confirmation #/[id number] Status is pending

## 2023-07-30 NOTE — Telephone Encounter (Signed)
Update sent to patient via MyChart

## 2023-07-30 NOTE — Telephone Encounter (Signed)
Pharmacy Patient Advocate Encounter  Received notification from [facility] [employer] that Prior Authorization for repatha has been APPROVED from 07/30/23 to 07/28/24. Ran test claim, Copay is $30.00. This test claim was processed through [facility]- copay amounts may vary at other pharmacies due to pharmacy/plan contracts, or as the patient moves through the different stages of their insurance plan.   PA #/Case ID/Reference #: [id number]

## 2023-08-07 ENCOUNTER — Other Ambulatory Visit (INDEPENDENT_AMBULATORY_CARE_PROVIDER_SITE_OTHER): Payer: Self-pay | Admitting: Family Medicine

## 2023-08-07 DIAGNOSIS — F5089 Other specified eating disorder: Secondary | ICD-10-CM

## 2023-08-08 ENCOUNTER — Ambulatory Visit (INDEPENDENT_AMBULATORY_CARE_PROVIDER_SITE_OTHER): Payer: BC Managed Care – PPO | Admitting: Family Medicine

## 2023-08-08 ENCOUNTER — Encounter (INDEPENDENT_AMBULATORY_CARE_PROVIDER_SITE_OTHER): Payer: Self-pay | Admitting: Family Medicine

## 2023-08-08 VITALS — BP 97/62 | HR 74 | Temp 98.2°F | Ht 62.0 in | Wt 184.0 lb

## 2023-08-08 DIAGNOSIS — E669 Obesity, unspecified: Secondary | ICD-10-CM | POA: Diagnosis not present

## 2023-08-08 DIAGNOSIS — F5089 Other specified eating disorder: Secondary | ICD-10-CM

## 2023-08-08 DIAGNOSIS — Z6833 Body mass index (BMI) 33.0-33.9, adult: Secondary | ICD-10-CM

## 2023-08-08 DIAGNOSIS — E559 Vitamin D deficiency, unspecified: Secondary | ICD-10-CM | POA: Diagnosis not present

## 2023-08-08 DIAGNOSIS — E66812 Obesity, class 2: Secondary | ICD-10-CM

## 2023-08-08 MED ORDER — LOMAIRA 8 MG PO TABS
8.0000 mg | ORAL_TABLET | Freq: Every day | ORAL | 0 refills | Status: DC
Start: 2023-08-08 — End: 2023-09-11

## 2023-08-08 MED ORDER — VITAMIN D (ERGOCALCIFEROL) 1.25 MG (50000 UNIT) PO CAPS
50000.0000 [IU] | ORAL_CAPSULE | ORAL | 0 refills | Status: DC
Start: 2023-08-08 — End: 2023-09-11

## 2023-08-08 MED ORDER — TOPIRAMATE 50 MG PO TABS: 50.0000 mg | ORAL_TABLET | Freq: Every day | ORAL | 0 refills | Status: DC

## 2023-08-08 NOTE — Progress Notes (Signed)
Chief Complaint:   OBESITY Crystal Shea is here to discuss her progress with her obesity treatment plan along with follow-up of her obesity related diagnoses. Crystal Shea is on the Category 1 Plan and states she is following her eating plan approximately 25% of the time. Crystal Shea states she is doing 0 minutes 0 times per week.  Today's visit was #: 7 Starting weight: 192 lbs Starting date: 03/20/2023 Today's weight: 184 lbs Today's date: 08/08/2023 Total lbs lost to date: 8 Total lbs lost since last in-office visit: 4  Interim History: Patient has been under quite a bit of stress recently.  She is in the process of suing the builder of her house and that has been a Energy manager emotionally. She is experiencing more time constraint. She used to be an emotional eater but now feels like she has gotten better.  She has more in control of food choices.  Her next few weeks are fairly stress filled. She wants to do the best she can in terms of following the meal plan over the next few weeks.   Subjective:   1. Vitamin D deficiency Patient is on prescription vitamin D.  She denies nausea, vomiting, or muscle weakness but notes fatigue.  2. Other disorder of eating Patient is on combination phentermine and topiramate with her starting weight of 193 pounds, and she is now at 184.6 pounds (so down 8.4 pounds).  Assessment/Plan:   1. Vitamin D deficiency We will refill prescription vitamin D 50,000 IU once weekly for 1 month.  - Vitamin D, Ergocalciferol, (DRISDOL) 1.25 MG (50000 UNIT) CAPS capsule; Take 1 capsule (50,000 Units total) by mouth every 7 (seven) days.  Dispense: 4 capsule; Refill: 0  2. Other disorder of eating Patient will continue her medications, and we will refill phentermine 8 mg and Topamax 50 mg for 1 month.  - topiramate (TOPAMAX) 50 MG tablet; Take 1 tablet (50 mg total) by mouth daily.  Dispense: 30 tablet; Refill: 0 - Phentermine HCl (LOMAIRA) 8 MG TABS; Take 1 tablet (8 mg  total) by mouth daily.  Dispense: 30 tablet; Refill: 0  3. BMI 33.0-33.9,adult  4. Obesity, starting BMI 35.12 Crystal Shea is currently in the action stage of change. As such, her goal is to continue with weight loss efforts. She has agreed to the Category 1 Plan and practicing portion control and making smarter food choices, such as increasing vegetables and decreasing simple carbohydrates.   Exercise goals: No exercise has been prescribed at this time.  Behavioral modification strategies: increasing lean protein intake, meal planning and cooking strategies, keeping healthy foods in the home, and planning for success.  Crystal Shea has agreed to follow-up with our clinic in 3 weeks. She was informed of the importance of frequent follow-up visits to maximize her success with intensive lifestyle modifications for her multiple health conditions.   Objective:   Blood pressure 97/62, pulse 74, temperature 98.2 F (36.8 C), height 5\' 2"  (1.575 m), weight 184 lb (83.5 kg), last menstrual period 02/03/2015, SpO2 100%. Body mass index is 33.65 kg/m.  General: Cooperative, alert, well developed, in no acute distress. HEENT: Conjunctivae and lids unremarkable. Cardiovascular: Regular rhythm.  Lungs: Normal work of breathing. Neurologic: No focal deficits.   Lab Results  Component Value Date   CREATININE 0.78 03/16/2023   BUN 12 03/16/2023   NA 139 03/16/2023   K 4.3 03/16/2023   CL 102 03/16/2023   CO2 26 03/16/2023   Lab Results  Component Value Date  ALT 46 (H) 03/16/2023   AST 25 03/16/2023   ALKPHOS 92 02/26/2022   BILITOT 0.5 03/16/2023   Lab Results  Component Value Date   HGBA1C 5.7 (H) 03/20/2023   HGBA1C 5.9 10/28/2021   Lab Results  Component Value Date   INSULIN 12.5 03/20/2023   Lab Results  Component Value Date   TSH 0.89 03/16/2023   Lab Results  Component Value Date   CHOL 195 03/20/2023   HDL 62 03/20/2023   LDLCALC 121 (H) 03/20/2023   TRIG 64 03/20/2023    CHOLHDL 3 04/27/2022   Lab Results  Component Value Date   VD25OH 20.8 (L) 03/20/2023   VD25OH 21.81 (L) 12/10/2020   Lab Results  Component Value Date   WBC 4.2 03/20/2023   HGB 13.8 03/20/2023   HCT 41.3 03/20/2023   MCV 89 03/20/2023   PLT 254 03/20/2023   No results found for: "IRON", "TIBC", "FERRITIN"  Attestation Statements:   Reviewed by clinician on day of visit: allergies, medications, problem list, medical history, surgical history, family history, social history, and previous encounter notes.   I, Burt Knack, am acting as transcriptionist for Reuben Likes, MD.  I have reviewed the above documentation for accuracy and completeness, and I agree with the above. - Reuben Likes, MD

## 2023-08-15 MED ORDER — REPATHA SURECLICK 140 MG/ML ~~LOC~~ SOAJ: 140.0000 mg | SUBCUTANEOUS | 11 refills | Status: DC

## 2023-08-15 NOTE — Addendum Note (Signed)
Addended by: Lindell Spar on: 08/15/2023 09:56 AM   Modules accepted: Orders

## 2023-08-25 ENCOUNTER — Other Ambulatory Visit (INDEPENDENT_AMBULATORY_CARE_PROVIDER_SITE_OTHER): Payer: Self-pay | Admitting: Family Medicine

## 2023-08-25 DIAGNOSIS — F5089 Other specified eating disorder: Secondary | ICD-10-CM

## 2023-09-11 ENCOUNTER — Encounter (INDEPENDENT_AMBULATORY_CARE_PROVIDER_SITE_OTHER): Payer: Self-pay | Admitting: Family Medicine

## 2023-09-11 ENCOUNTER — Ambulatory Visit (INDEPENDENT_AMBULATORY_CARE_PROVIDER_SITE_OTHER): Payer: BC Managed Care – PPO | Admitting: Family Medicine

## 2023-09-11 VITALS — BP 102/68 | HR 70 | Temp 98.3°F | Ht 62.0 in | Wt 186.0 lb

## 2023-09-11 DIAGNOSIS — F5089 Other specified eating disorder: Secondary | ICD-10-CM

## 2023-09-11 DIAGNOSIS — F509 Eating disorder, unspecified: Secondary | ICD-10-CM | POA: Insufficient documentation

## 2023-09-11 DIAGNOSIS — E66812 Obesity, class 2: Secondary | ICD-10-CM

## 2023-09-11 DIAGNOSIS — E559 Vitamin D deficiency, unspecified: Secondary | ICD-10-CM | POA: Diagnosis not present

## 2023-09-11 DIAGNOSIS — E669 Obesity, unspecified: Secondary | ICD-10-CM

## 2023-09-11 DIAGNOSIS — Z6834 Body mass index (BMI) 34.0-34.9, adult: Secondary | ICD-10-CM

## 2023-09-11 MED ORDER — VITAMIN D (ERGOCALCIFEROL) 1.25 MG (50000 UNIT) PO CAPS: 50000.0000 [IU] | ORAL_CAPSULE | ORAL | 0 refills | Status: DC

## 2023-09-11 MED ORDER — LOMAIRA 8 MG PO TABS: 8.0000 mg | ORAL_TABLET | Freq: Every day | ORAL | 0 refills | Status: DC

## 2023-09-11 MED ORDER — TOPIRAMATE 50 MG PO TABS: 50.0000 mg | ORAL_TABLET | Freq: Every day | ORAL | 0 refills | Status: DC

## 2023-09-11 NOTE — Assessment & Plan Note (Signed)
 Discussed importance of vitamin d supplementation.  Vitamin d supplementation has been shown to decrease fatigue, decrease risk of progression to insulin resistance and then prediabetes, decreases risk of falling in older age and can even assist in decreasing depressive symptoms in PTSD.   Refill for Vitamin D sent in.

## 2023-09-11 NOTE — Assessment & Plan Note (Signed)
Previously tried [place].  She started makeshift qsymia at 193lbs and needs to lose 9.5lbs by mid December.  Current weight is 186 (down 7lbs).  PDMP checked and no concerns noted.  She needs a refill of her lomaira and topiramate today.  Refills sent in at same dose- consider increase if 5% weight loss not achieved.

## 2023-09-11 NOTE — Progress Notes (Signed)
SUBJECTIVE:  Chief Complaint: Obesity  Interim History: Patient still going through the process of getting her foundation fixed.  This is an entire process.  Her kids all came to her house and celebrated her birthday.  They all went to the movie theater to see Gladiator.  She got  flowers delivered to work and got a nice card and gift at work.  Food wise she has been on plan.  She feels full most of the time so she struggles with getting all the protein in.  She isn't always able to get all protein in.  She recognizes she isn't able to get all food in consistently.    Crystal Shea is here to discuss her progress with her obesity treatment plan. She is on the Category 1 Plan and practicing portion control and making smarter food choices, such as increasing vegetables and decreasing simple carbohydrates and states she is following her eating plan approximately 60 % of the time. She states she is exercising 20 minutes 4 times per week.   OBJECTIVE: Visit Diagnoses: Problem List Items Addressed This Visit       Other   Vitamin D deficiency - Primary    Discussed importance of vitamin d supplementation.  Vitamin d supplementation has been shown to decrease fatigue, decrease risk of progression to insulin resistance and then prediabetes, decreases risk of falling in older age and can even assist in decreasing depressive symptoms in PTSD.   Refill for Vitamin D sent in.        Relevant Medications   Vitamin D, Ergocalciferol, (DRISDOL) 1.25 MG (50000 UNIT) CAPS capsule   Disorder of eating    Previously tried [place].  She started makeshift qsymia at 193lbs and needs to lose 9.5lbs by mid December.  Current weight is 186 (down 7lbs).  PDMP checked and no concerns noted.  She needs a refill of her lomaira and topiramate today.  Refills sent in at same dose- consider increase if 5% weight loss not achieved.      Relevant Medications   topiramate (TOPAMAX) 50 MG tablet   Phentermine HCl  (LOMAIRA) 8 MG TABS    Vitals Temp: 98.3 F (36.8 C) BP: 102/68 Pulse Rate: 70 SpO2: 100 %   Anthropometric Measurements Height: 5\' 2"  (1.575 m) Weight: 186 lb (84.4 kg) BMI (Calculated): 34.01 Weight at Last Visit: 184 lb Weight Lost Since Last Visit: 0 Weight Gained Since Last Visit: 2 Starting Weight: 192 lb Total Weight Loss (lbs): 6 lb (2.722 kg)   Body Composition  Body Fat %: 42.9 % Fat Mass (lbs): 79.8 lbs Muscle Mass (lbs): 100.8 lbs Total Body Water (lbs): 68.6 lbs Visceral Fat Rating : 11   Other Clinical Data Today's Visit #: 7 Starting Date: 03/20/23     ASSESSMENT AND PLAN:  Diet: Crystal Shea is currently in the action stage of change. As such, her goal is to continue with weight loss efforts. She has agreed to Category 1 Plan.  Holiday recipe handout given.  Exercise: Crystal Shea has been instructed that some exercise is better than none for weight loss and overall health benefits.   Behavior Modification:  We discussed the following Behavioral Modification Strategies today: increasing lean protein intake, increasing vegetables, meal planning and cooking strategies, and holiday eating strategies. We discussed various medication options to help Crystal Shea with her weight loss efforts and we both agreed to continue current medications at same dose.  Will likely need dose increase at next appointment if she cannot  get down to the 182lbs.  No follow-ups on file.Marland Kitchen She was informed of the importance of frequent follow up visits to maximize her success with intensive lifestyle modifications for her multiple health conditions.  Attestation Statements:   Reviewed by clinician on day of visit: allergies, medications, problem list, medical history, surgical history, family history, social history, and previous encounter notes.   Reuben Likes, MD

## 2023-09-18 ENCOUNTER — Institutional Professional Consult (permissible substitution) (HOSPITAL_BASED_OUTPATIENT_CLINIC_OR_DEPARTMENT_OTHER): Payer: BC Managed Care – PPO | Admitting: Internal Medicine

## 2023-10-03 ENCOUNTER — Ambulatory Visit (INDEPENDENT_AMBULATORY_CARE_PROVIDER_SITE_OTHER): Payer: BC Managed Care – PPO | Admitting: Family Medicine

## 2023-10-03 ENCOUNTER — Encounter (INDEPENDENT_AMBULATORY_CARE_PROVIDER_SITE_OTHER): Payer: Self-pay | Admitting: Family Medicine

## 2023-10-03 VITALS — BP 113/76 | HR 64 | Temp 98.3°F | Ht 62.0 in | Wt 187.0 lb

## 2023-10-03 DIAGNOSIS — R7303 Prediabetes: Secondary | ICD-10-CM

## 2023-10-03 DIAGNOSIS — E66812 Obesity, class 2: Secondary | ICD-10-CM

## 2023-10-03 DIAGNOSIS — Z6834 Body mass index (BMI) 34.0-34.9, adult: Secondary | ICD-10-CM

## 2023-10-03 DIAGNOSIS — E669 Obesity, unspecified: Secondary | ICD-10-CM

## 2023-10-03 DIAGNOSIS — F5089 Other specified eating disorder: Secondary | ICD-10-CM | POA: Diagnosis not present

## 2023-10-03 MED ORDER — TOPIRAMATE 50 MG PO TABS
75.0000 mg | ORAL_TABLET | Freq: Every day | ORAL | 0 refills | Status: DC
Start: 2023-10-03 — End: 2023-11-12

## 2023-10-03 MED ORDER — LOMAIRA 8 MG PO TABS: 12.0000 mg | ORAL_TABLET | Freq: Every day | ORAL | 0 refills | Status: DC

## 2023-10-03 NOTE — Progress Notes (Signed)
   SUBJECTIVE:  Chief Complaint: Obesity  Interim History: Thanksgiving was good for the most part.  Stayed local for her holiday.  Since Thanksgiving she has been working and getting everything wrapped up in prep for her time off. She is not anticipating doing much with her time off.  She may decide to go somewhere but she isn't sure yet.  Over the last few weeks she has been doing reasonably well on her meal plan.  She hasn't been eating badly. She is noticing feelings of getting fuller faster.  She is occasionally indulging in carbs like candy.   [facility] is here to discuss her progress with her obesity treatment plan. She is on the Category 1 Plan and states she is following her eating plan approximately 80 % of the time. She states she is exercising 15 minutes 5 times per week.   OBJECTIVE: Visit Diagnoses: Problem List Items Addressed This Visit   None   No data recorded No data recorded No data recorded No data recorded   ASSESSMENT AND PLAN:  Diet: Laurelei is currently in the action stage of change. As such, her goal is to continue with weight loss efforts. She has agreed to Category 1 Plan.  Exercise: Nakecia has been instructed that some exercise is better than none for weight loss and overall health benefits.  Make a plan for consistent 3 times a week activity for 10-15 minutes.   Behavior Modification:  We discussed the following Behavioral Modification Strategies today: increasing lean protein intake, increasing vegetables, meal planning and cooking strategies, better snacking choices, holiday eating strategies, avoiding temptations, and planning for success. We discussed various medication options to help [facility] with her weight loss efforts and we both agreed to increase lomaira to 12mg  and increase topiramate to 75mg  daily.  No follow-ups on file.Marland Kitchen She was informed of the importance of frequent follow up visits to maximize her success with intensive lifestyle  modifications for her multiple health conditions.  Attestation Statements:   Reviewed by clinician on day of visit: allergies, medications, problem list, medical history, surgical history, family history, social history, and previous encounter notes.      Reuben Likes MD

## 2023-10-03 NOTE — Assessment & Plan Note (Signed)
Last A1c in 5.7 in June.  She is on combination lomaira and topiramate but has not achieved 5% loss yet.  Still indulging in simple carbohydrates occasionally.  Needs to repeat labs in 4 weeks.

## 2023-10-03 NOTE — Assessment & Plan Note (Signed)
Patient started makeshift qsymia at 193lbs.  She is at 187 today so lost a total of 6lbs which is not 5% goal.  Will increase doses to 12mg  of Lomaira and 75mg  of Topiramate to see if that can help with scale change and food choice control.

## 2023-10-19 ENCOUNTER — Other Ambulatory Visit: Payer: Self-pay | Admitting: Internal Medicine

## 2023-10-19 DIAGNOSIS — E785 Hyperlipidemia, unspecified: Secondary | ICD-10-CM

## 2023-10-19 DIAGNOSIS — E7841 Elevated Lipoprotein(a): Secondary | ICD-10-CM

## 2023-11-12 ENCOUNTER — Encounter (INDEPENDENT_AMBULATORY_CARE_PROVIDER_SITE_OTHER): Payer: Self-pay | Admitting: Family Medicine

## 2023-11-12 ENCOUNTER — Ambulatory Visit (INDEPENDENT_AMBULATORY_CARE_PROVIDER_SITE_OTHER): Payer: 59 | Admitting: Family Medicine

## 2023-11-12 VITALS — BP 109/70 | HR 63 | Temp 98.5°F | Ht 62.0 in | Wt 184.0 lb

## 2023-11-12 DIAGNOSIS — E559 Vitamin D deficiency, unspecified: Secondary | ICD-10-CM | POA: Diagnosis not present

## 2023-11-12 DIAGNOSIS — I10 Essential (primary) hypertension: Secondary | ICD-10-CM

## 2023-11-12 DIAGNOSIS — E66812 Obesity, class 2: Secondary | ICD-10-CM

## 2023-11-12 DIAGNOSIS — E785 Hyperlipidemia, unspecified: Secondary | ICD-10-CM

## 2023-11-12 DIAGNOSIS — R7303 Prediabetes: Secondary | ICD-10-CM | POA: Diagnosis not present

## 2023-11-12 DIAGNOSIS — E7849 Other hyperlipidemia: Secondary | ICD-10-CM

## 2023-11-12 DIAGNOSIS — Z6833 Body mass index (BMI) 33.0-33.9, adult: Secondary | ICD-10-CM

## 2023-11-12 MED ORDER — LISINOPRIL-HYDROCHLOROTHIAZIDE 10-12.5 MG PO TABS: 1.0000 | ORAL_TABLET | Freq: Every day | ORAL | 0 refills | Status: DC

## 2023-11-12 MED ORDER — QSYMIA 11.25-69 MG PO CP24: 1.0000 | ORAL_CAPSULE | Freq: Every day | ORAL | 0 refills | Status: DC

## 2023-11-12 NOTE — Progress Notes (Unsigned)
   SUBJECTIVE:  Chief Complaint: Obesity  Interim History: Patient feeling somewhat weak and tired recently.  She has held her BP medication due to lower blood pressures recently.  She did take her BP meds again today.  She is doing eggs and toast in the am, protein pack with nuts, raisins and other foods, yogurt, turkey/ chicken.  Is getting more energy from the protein intake.    Crystal Shea is here to discuss her progress with her obesity treatment plan. She is on the Category 1 Plan and states she is following her eating plan approximately 80 % of the time. She states she is exercising 10 minutes 5 times per week.   OBJECTIVE: Visit Diagnoses: Problem List Items Addressed This Visit   None   No data recorded Anthropometric Measurements Height: 5\' 2"  (1.575 m) Weight at Last Visit: 187 lb Starting Weight: 192 lb   No data recorded Other Clinical Data Today's Visit #: 9 Starting Date: 03/20/23     ASSESSMENT AND PLAN:  Diet: Crystal Shea is currently in the action stage of change. As such, her goal is to continue with weight loss efforts. She has agreed to Category 1 Plan.  Exercise: Crystal Shea has been instructed that some exercise is better than none for weight loss and overall health benefits.   Behavior Modification:  We discussed the following Behavioral Modification Strategies today: increasing lean protein intake, increasing vegetables, no skipping meals, meal planning and cooking strategies, and better snacking choices. We discussed various medication options to help Crystal Shea with her weight loss efforts and we both agreed to ***.  No follow-ups on file.Marland Kitchen She was informed of the importance of frequent follow up visits to maximize her success with intensive lifestyle modifications for her multiple health conditions.  Attestation Statements:   Reviewed by clinician on day of visit: allergies, medications, problem list, medical history, surgical history, family history, social  history, and previous encounter notes.   Time spent on visit including pre-visit chart review and post-visit care and charting was *** minutes.    Reuben Likes, MD

## 2023-11-13 LAB — HEMOGLOBIN A1C
Est. average glucose Bld gHb Est-mCnc: 117 mg/dL
Hgb A1c MFr Bld: 5.7 % — ABNORMAL HIGH (ref 4.8–5.6)

## 2023-11-13 LAB — LIPID PANEL WITH LDL/HDL RATIO
Cholesterol, Total: 167 mg/dL (ref 100–199)
HDL: 51 mg/dL (ref >39)
LDL Chol Calc (NIH): 104 mg/dL — ABNORMAL HIGH (ref 0–99)
LDL/HDL Ratio: 2 {ratio} (ref 0.0–3.2)
Triglycerides: 58 mg/dL (ref 0–149)
VLDL Cholesterol Cal: 12 mg/dL (ref 5–40)

## 2023-11-13 LAB — COMPREHENSIVE METABOLIC PANEL
ALT: 23 [IU]/L (ref 0–32)
AST: 24 [IU]/L (ref 0–40)
Albumin: 3.9 g/dL (ref 3.8–4.9)
Alkaline Phosphatase: 120 [IU]/L (ref 44–121)
BUN/Creatinine Ratio: 16 (ref 9–23)
BUN: 16 mg/dL (ref 6–24)
Bilirubin Total: 0.5 mg/dL (ref 0.0–1.2)
CO2: 23 mmol/L (ref 20–29)
Calcium: 9.2 mg/dL (ref 8.7–10.2)
Chloride: 106 mmol/L (ref 96–106)
Creatinine, Ser: 0.99 mg/dL (ref 0.57–1.00)
Globulin, Total: 3.4 g/dL (ref 1.5–4.5)
Glucose: 87 mg/dL (ref 70–99)
Potassium: 3.7 mmol/L (ref 3.5–5.2)
Sodium: 142 mmol/L (ref 134–144)
Total Protein: 7.3 g/dL (ref 6.0–8.5)
eGFR: 69 mL/min/{1.73_m2} (ref >59)

## 2023-11-13 LAB — INSULIN, RANDOM: INSULIN: 6 u[IU]/mL (ref 2.6–24.9)

## 2023-11-13 LAB — VITAMIN D 25 HYDROXY (VIT D DEFICIENCY, FRACTURES): Vit D, 25-Hydroxy: 45.8 ng/mL (ref 30.0–100.0)

## 2023-11-14 DIAGNOSIS — E66812 Obesity, class 2: Secondary | ICD-10-CM | POA: Insufficient documentation

## 2023-11-14 NOTE — Assessment & Plan Note (Signed)
Prior A1c 7 months ago of 5.7 with insulin of 12.5.  Patient has been making mindful food decisions in terms of macronutrient composition since that time.  We will repeat hemoglobin A1c and insulin level today.

## 2023-11-14 NOTE — Assessment & Plan Note (Signed)
Last vitamin D level 7 months ago was low at 20.8.  Patient has reported some improvement in fatigue but not much.  She has been on supplemental prescription strength vitamin D since that time.  She is under quite a bit of stress with the situation is going on with her house.  Patient has been on supplemental prescription strength vitamin D for the last 7 months.  Vitamin D level 7 months ago was 20.8.  Patient reports fatigue that has somewhat improved but not significantly.  She has been under considerable stress dealing with the situation with her house

## 2023-11-14 NOTE — Assessment & Plan Note (Addendum)
Patient was started on makeshift Qsymia at prior appointments.  She was told by the pharmacist that her insurance company would cover actual Qsymia.  She understands she will need a prior authorization before getting this medication.  Qsymia 11.25-69 mg was sent in as that is the dose she was on in equivalent makeshift.  Started makeshift Qsymia at 193 pounds and patient is currently at 184 pounds.

## 2023-11-14 NOTE — Assessment & Plan Note (Signed)
Initial labs showed LDL of 121, HDL 62, triglycerides 64, total cholesterol 195.  She had a lipoprotein a level done in the interim from that labs 7 months ago to today with lipoprotein level of 139.7.  She is on Zetia for cholesterol management.  She is also on Repatha.  Repeat fasting cholesterol panel today.

## 2023-11-14 NOTE — Assessment & Plan Note (Addendum)
Patient's blood pressure is controlled today and has been for the previous 4 appointments.  Given how well-controlled her blood pressure has been we will decrease her dose of lisinopril hydrochlorothiazide from 20-12 0.02-23-11.5.  We can follow-up on her blood pressures at subsequent appointments with decrease in dosage.  Will repeat comprehensive metabolic panel today.

## 2023-11-19 ENCOUNTER — Encounter (INDEPENDENT_AMBULATORY_CARE_PROVIDER_SITE_OTHER): Payer: Self-pay

## 2023-11-21 ENCOUNTER — Other Ambulatory Visit (INDEPENDENT_AMBULATORY_CARE_PROVIDER_SITE_OTHER): Payer: Self-pay | Admitting: Family Medicine

## 2023-11-21 DIAGNOSIS — E559 Vitamin D deficiency, unspecified: Secondary | ICD-10-CM

## 2023-11-28 ENCOUNTER — Telehealth: Payer: Self-pay | Admitting: Internal Medicine

## 2023-11-28 ENCOUNTER — Other Ambulatory Visit (HOSPITAL_COMMUNITY): Payer: Self-pay

## 2023-11-28 MED ORDER — REPATHA SURECLICK 140 MG/ML ~~LOC~~ SOAJ
140.0000 mg | SUBCUTANEOUS | 11 refills | Status: DC
  Filled 2023-11-28: qty 2, 28d supply, fill #0
  Filled 2024-01-02: qty 2, 28d supply, fill #1
  Filled 2024-02-05: qty 2, 28d supply, fill #2
  Filled 2024-04-08 – 2024-07-10 (×2): qty 2, 28d supply, fill #3

## 2023-11-28 NOTE — Telephone Encounter (Signed)
*  STAT* If patient is at the pharmacy, call can be transferred to refill team.   1. Which medications need to be refilled? (please list name of each medication and dose if known) Evolocumab (REPATHA SURECLICK) 140 MG/ML SOAJ    2. Would you like to learn more about the convenience, safety, & potential cost savings by using the [facility]?    3. Are you open to using the [facility] (Type [facility].  ).   4. Which pharmacy/location (including street and city if local pharmacy) is medication to be sent to? [facility] [place] [facility]    5. Do they need a 30 day or 90 day supply? 90 day CHANGE IN PHARMACY

## 2023-11-29 ENCOUNTER — Encounter (HOSPITAL_BASED_OUTPATIENT_CLINIC_OR_DEPARTMENT_OTHER): Payer: BC Managed Care – PPO | Admitting: Internal Medicine

## 2023-12-03 ENCOUNTER — Ambulatory Visit (INDEPENDENT_AMBULATORY_CARE_PROVIDER_SITE_OTHER): Payer: 59 | Admitting: Family Medicine

## 2023-12-03 ENCOUNTER — Encounter (INDEPENDENT_AMBULATORY_CARE_PROVIDER_SITE_OTHER): Payer: Self-pay | Admitting: Family Medicine

## 2023-12-03 VITALS — BP 97/65 | HR 97 | Temp 98.1°F | Ht 62.0 in | Wt 180.0 lb

## 2023-12-03 DIAGNOSIS — I10 Essential (primary) hypertension: Secondary | ICD-10-CM | POA: Diagnosis not present

## 2023-12-03 DIAGNOSIS — E66812 Obesity, class 2: Secondary | ICD-10-CM | POA: Diagnosis not present

## 2023-12-03 DIAGNOSIS — Z6832 Body mass index (BMI) 32.0-32.9, adult: Secondary | ICD-10-CM | POA: Diagnosis not present

## 2023-12-03 MED ORDER — LISINOPRIL 5 MG PO TABS: 5.0000 mg | ORAL_TABLET | Freq: Every day | ORAL | 0 refills | Status: DC

## 2023-12-03 MED ORDER — HYDROCHLOROTHIAZIDE 12.5 MG PO TABS
12.5000 mg | ORAL_TABLET | Freq: Every day | ORAL | 0 refills | Status: DC
Start: 2023-12-03 — End: 2024-01-01

## 2023-12-03 MED ORDER — QSYMIA 11.25-69 MG PO CP24: 1.0000 | ORAL_CAPSULE | Freq: Every day | ORAL | 0 refills | Status: DC

## 2023-12-03 NOTE — Progress Notes (Signed)
 SUBJECTIVE:  Chief Complaint: Obesity  Interim History: Last few weeks have been alright.  She has been busy with health stuff and has felt sluggish.  She has felt somewhat down in terms of her energy level.  She is looking for an energy boost.  She has been able to be consistent with her food intake.  She is trying to stay consistent.   Arissa is here to discuss her progress with her obesity treatment plan. She is on the Category 1 Plan and states she is following her eating plan approximately 80 % of the time. She states she is exercising 15 minutes 5 times per week.   OBJECTIVE: Visit Diagnoses: Problem List Items Addressed This Visit       Cardiovascular and Mediastinum   Essential hypertension - Primary   Patients blood pressure very well managed today- lower range of normal.  Will decrease her lisinopril to 5mg  and continue 12.5mg  hydrochlorothiazide.  Follow up on BP at next appointment.  New prescriptions sent to pharmacy.      Relevant Medications   lisinopril (ZESTRIL) 5 MG tablet   hydrochlorothiazide (HYDRODIURIL) 12.5 MG tablet     Other   Class 2 severe obesity due to excess calories with serious comorbidity and body mass index (BMI) of 35.0 to 35.9 in adult [facility])   Anthropometric Measurements Height: 5\' 2"  (1.575 m) Weight: 180 lb (81.6 kg) BMI (Calculated): 32.91 Weight at Last Visit: 184 lb Weight Lost Since Last Visit: 4 Weight Gained Since Last Visit: 0 Starting Weight: 192 lb Total Weight Loss (lbs): 12 lb (5.443 kg)  Body Composition  Body Fat %: 42.4 % Fat Mass (lbs): 76.6 lbs Muscle Mass (lbs): 99 lbs Total Body Water (lbs): 66.6 lbs Visceral Fat Rating : 10   Patient doing well on qsymia- Started makeshift qsymia at 193lbs and now at 180lbs.  No side effects noted.  PDMP checked today- needs refill of her medication      Relevant Medications   Phentermine-Topiramate (QSYMIA) 11.25-69 MG CP24   Other Visit Diagnoses       Class 2 severe  obesity with serious comorbidity and body mass index (BMI) of 35.0 to 35.9 in adult, unspecified obesity type (HCC)       Relevant Medications   Phentermine-Topiramate (QSYMIA) 11.25-69 MG CP24     BMI 32.0-32.9,adult           No data recorded    12/03/2023    2:00 PM 11/12/2023    9:00 AM 10/03/2023    3:00 PM  Vitals with BMI  Height 5\' 2"  5\' 2"  5\' 2"   Weight 180 lbs 184 lbs 187 lbs  BMI 32.91 33.65 34.19  Systolic 97 109 113  Diastolic 65 70 76  Pulse 97 63 64      No data recorded    ASSESSMENT AND PLAN:  Diet: Mitchell is currently in the action stage of change. As such, her goal is to continue with weight loss efforts. She has agreed to Category 2 Plan.  Exercise: Yumna has been instructed that some exercise is better than none for weight loss and overall health benefits.   Behavior Modification:  We discussed the following Behavioral Modification Strategies today: increasing lean protein intake, meal planning and cooking strategies, keeping healthy foods in the home, and planning for success. We discussed various medication options to help [place] with her weight loss efforts and we both agreed to continue qsymia at current dose- needs refill today.  No  follow-ups on file.Marland Kitchen She was informed of the importance of frequent follow up visits to maximize her success with intensive lifestyle modifications for her multiple health conditions.  Attestation Statements:   Reviewed by clinician on day of visit: allergies, medications, problem list, medical history, surgical history, family history, social history, and previous encounter notes.      Reuben Likes, MD

## 2023-12-03 NOTE — Assessment & Plan Note (Signed)
Patients blood pressure very well managed today- lower range of normal.  Will decrease her lisinopril to 5mg  and continue 12.5mg  hydrochlorothiazide.  Follow up on BP at next appointment.  New prescriptions sent to pharmacy.

## 2023-12-12 ENCOUNTER — Other Ambulatory Visit (INDEPENDENT_AMBULATORY_CARE_PROVIDER_SITE_OTHER): Payer: Self-pay | Admitting: Family Medicine

## 2023-12-12 DIAGNOSIS — I10 Essential (primary) hypertension: Secondary | ICD-10-CM

## 2023-12-16 NOTE — Assessment & Plan Note (Signed)
 Anthropometric Measurements Height: 5\' 2"  (1.575 m) Weight: 180 lb (81.6 kg) BMI (Calculated): 32.91 Weight at Last Visit: 184 lb Weight Lost Since Last Visit: 4 Weight Gained Since Last Visit: 0 Starting Weight: 192 lb Total Weight Loss (lbs): 12 lb (5.443 kg)  Body Composition  Body Fat %: 42.4 % Fat Mass (lbs): 76.6 lbs Muscle Mass (lbs): 99 lbs Total Body Water (lbs): 66.6 lbs Visceral Fat Rating : 10   Patient doing well on qsymia- Started makeshift qsymia at 193lbs and now at 180lbs.  No side effects noted.  PDMP checked today- needs refill of her medication

## 2023-12-21 ENCOUNTER — Other Ambulatory Visit (INDEPENDENT_AMBULATORY_CARE_PROVIDER_SITE_OTHER): Payer: Self-pay | Admitting: Family Medicine

## 2023-12-21 DIAGNOSIS — E559 Vitamin D deficiency, unspecified: Secondary | ICD-10-CM

## 2023-12-31 ENCOUNTER — Other Ambulatory Visit (INDEPENDENT_AMBULATORY_CARE_PROVIDER_SITE_OTHER): Payer: Self-pay | Admitting: Family Medicine

## 2023-12-31 DIAGNOSIS — I10 Essential (primary) hypertension: Secondary | ICD-10-CM

## 2024-01-01 ENCOUNTER — Encounter (INDEPENDENT_AMBULATORY_CARE_PROVIDER_SITE_OTHER): Payer: Self-pay | Admitting: Family Medicine

## 2024-01-01 ENCOUNTER — Ambulatory Visit (INDEPENDENT_AMBULATORY_CARE_PROVIDER_SITE_OTHER): Payer: 59 | Admitting: Family Medicine

## 2024-01-01 VITALS — BP 120/73 | HR 65 | Temp 97.9°F | Ht 62.0 in | Wt 183.0 lb

## 2024-01-01 DIAGNOSIS — E66812 Obesity, class 2: Secondary | ICD-10-CM | POA: Diagnosis not present

## 2024-01-01 DIAGNOSIS — Z6833 Body mass index (BMI) 33.0-33.9, adult: Secondary | ICD-10-CM

## 2024-01-01 DIAGNOSIS — I10 Essential (primary) hypertension: Secondary | ICD-10-CM | POA: Diagnosis not present

## 2024-01-01 DIAGNOSIS — E559 Vitamin D deficiency, unspecified: Secondary | ICD-10-CM | POA: Diagnosis not present

## 2024-01-01 MED ORDER — QSYMIA 11.25-69 MG PO CP24
1.0000 | ORAL_CAPSULE | Freq: Every day | ORAL | 0 refills | Status: DC
Start: 2024-01-01 — End: 2024-02-05

## 2024-01-01 MED ORDER — LISINOPRIL 5 MG PO TABS: 5.0000 mg | ORAL_TABLET | Freq: Every day | ORAL | 0 refills | Status: DC

## 2024-01-01 MED ORDER — VITAMIN D (ERGOCALCIFEROL) 1.25 MG (50000 UNIT) PO CAPS: 50000.0000 [IU] | ORAL_CAPSULE | ORAL | 0 refills | Status: DC

## 2024-01-01 MED ORDER — HYDROCHLOROTHIAZIDE 12.5 MG PO TABS: 12.5000 mg | ORAL_TABLET | Freq: Every day | ORAL | 0 refills | Status: DC

## 2024-01-01 NOTE — Assessment & Plan Note (Signed)
 Vitamin D not sent in at last appointment.  Last level from January below goal.  Will send in 3 months of Vitamin D.

## 2024-01-01 NOTE — Assessment & Plan Note (Signed)
 Blood pressure very well controlled today.  No chest pain, chest pressure or headache.  Needs refill of lisinopril and hydrochlorothiazide today.  No change in dosage- follow up on BP at next appointment.

## 2024-01-01 NOTE — Assessment & Plan Note (Signed)
 Patient doing well on qsymia- Started makeshift qsymia at 193lbs and now at 183lbs.  PDMP checked today- needs refill of her medication.  Eating nutritious food but not enough of it so cannot increase dose to max dose yet.

## 2024-01-01 NOTE — Progress Notes (Signed)
 SUBJECTIVE:  Chief Complaint: Obesity  Interim History: Patient has been working and had a few days off and spent some time babysitting her grandson and went to the beach since last appointment. She has gotten more consistent food on plan in.  Eggs are expensive so she has been opting for yogurt options.  She is limiting her red meat intake.  She is more conscious about what she is eating overall.  She has decreased her sweets intake.  She feels full but isn't sure if she is getting enough in.   Crystal Shea is here to discuss her progress with her obesity treatment plan. She is on the Category 1 Plan and states she is following her eating plan approximately 80 % of the time. She states she is exercising 10-15 minutes 5 times per week.   OBJECTIVE: Visit Diagnoses: Problem List Items Addressed This Visit       Cardiovascular and Mediastinum   Essential hypertension   Blood pressure very well controlled today.  No chest pain, chest pressure or headache.  Needs refill of lisinopril and hydrochlorothiazide today.  No change in dosage- follow up on BP at next appointment.      Relevant Medications   hydrochlorothiazide (HYDRODIURIL) 12.5 MG tablet   lisinopril (ZESTRIL) 5 MG tablet     Other   Vitamin D deficiency - Primary   Vitamin D not sent in at last appointment.  Last level from January below goal.  Will send in 3 months of Vitamin D.      Relevant Medications   Vitamin D, Ergocalciferol, (DRISDOL) 1.25 MG (50000 UNIT) CAPS capsule   Class 2 severe obesity due to excess calories with serious comorbidity and body mass index (BMI) of 35.0 to 35.9 in adult [facility])   Patient doing well on qsymia- Started makeshift qsymia at 193lbs and now at 183lbs.  PDMP checked today- needs refill of her medication.  Eating nutritious food but not enough of it so cannot increase dose to max dose yet.      Relevant Medications   Phentermine-Topiramate (QSYMIA) 11.25-69 MG CP24   Other Visit Diagnoses        Class 2 severe obesity with serious comorbidity and body mass index (BMI) of 35.0 to 35.9 in adult, unspecified obesity type (HCC)       Relevant Medications   Phentermine-Topiramate (QSYMIA) 11.25-69 MG CP24     BMI 33.0-33.9,adult           Vitals Temp: 97.9 F (36.6 C) BP: 120/73 Pulse Rate: 65 SpO2: 100 %   Anthropometric Measurements Height: 5\' 2"  (1.575 m) Weight: 183 lb (83 kg) BMI (Calculated): 33.46 Weight at Last Visit: 180 lb Weight Lost Since Last Visit: 0 Weight Gained Since Last Visit: 3 Starting Weight: 192 lb Total Weight Loss (lbs): 9 lb (4.082 kg)   Body Composition  Body Fat %: 43.7 % Fat Mass (lbs): 80.2 lbs Muscle Mass (lbs): 98.2 lbs Total Body Water (lbs): 69.6 lbs Visceral Fat Rating : 11   Other Clinical Data Today's Visit #: 11 Starting Date: 03/20/23 Comments: Cat 1     ASSESSMENT AND PLAN:  Diet: [name] is currently in the action stage of change. As such, her goal is to continue with weight loss efforts and has agreed to the Category 1 Plan.   Exercise:  For substantial health benefits, adults should do at least 150 minutes (2 hours and 30 minutes) a week of moderate-intensity, or 75 minutes (1 hour and 15 minutes)  a week of vigorous-intensity aerobic physical activity, or an equivalent combination of moderate- and vigorous-intensity aerobic activity. Aerobic activity should be performed in episodes of at least 10 minutes, and preferably, it should be spread throughout the week.  Behavior Modification:  We discussed the following Behavioral Modification Strategies today: increasing lean protein intake, increasing vegetables, meal planning and cooking strategies, keeping healthy foods in the home, avoiding temptations, and planning for success. We discussed various medication options to help [facility] with her weight loss efforts and we both agreed to refill Qsymia at current dose.  No follow-ups on file.Marland Kitchen She was informed of the  importance of frequent follow up visits to maximize her success with intensive lifestyle modifications for her multiple health conditions.  Attestation Statements:   Reviewed by clinician on day of visit: allergies, medications, problem list, medical history, surgical history, family history, social history, and previous encounter notes.   Reuben Likes, MD

## 2024-01-04 ENCOUNTER — Other Ambulatory Visit (HOSPITAL_COMMUNITY): Payer: Self-pay

## 2024-01-06 ENCOUNTER — Other Ambulatory Visit (INDEPENDENT_AMBULATORY_CARE_PROVIDER_SITE_OTHER): Payer: Self-pay | Admitting: Family Medicine

## 2024-01-06 DIAGNOSIS — I10 Essential (primary) hypertension: Secondary | ICD-10-CM

## 2024-01-25 ENCOUNTER — Ambulatory Visit: Payer: Self-pay

## 2024-01-25 NOTE — Telephone Encounter (Signed)
    Chief Complaint: bilateral ear congestion Symptoms: bilateral ear congestion, low grade fever, body aches, chills Frequency: since Wednesday Pertinent Negatives: Patient denies sob, runny nose Disposition: [] ED /[] Urgent Care (no appt availability in office) / [x] Appointment(In office/virtual)/ []  [facility]/ [] Home Care/ [] Refused Recommended Disposition /[] [facility]/ []  Follow-up with PCP Additional Notes: Patient reports she has been experiencing bilateral ear congesion, low grade fever, body aches, and chills since Wednesday. Patient reports she has tried OTC medication with no relief. Per protocol, appt scheduled 01/28/24 at patient request. Patient advised to call back with worsening symptoms. Patient verbalized understanding.    Copied From CRM [id number]. Reason for Triage: Fever. Patient had a fever of 100 last night 4/10. Patient also experiencing a headache, chills, and body aches. Symptoms started 2 days ago. Callback number for patient is [phone].   Reason for Disposition  Ear congestion present > 48 hours  Answer Assessment - Initial Assessment Questions 1. TEMPERATURE: "What is the most recent temperature?"  "How was it measured?"      99.33F 2. ONSET: "When did the fever start?"      wednesday 3. CHILLS: "Do you have chills?" If yes: "How bad are they?"  (e.g., none, mild, moderate, severe)   - NONE: no chills   - MILD: feeling cold   - MODERATE: feeling very cold, some shivering (feels better under a thick blanket)   - SEVERE: feeling extremely cold with shaking chills (general body shaking, rigors; even under a thick blanket)      mild 4. OTHER SYMPTOMS: "Do you have any other symptoms besides the fever?"  (e.g., abdomen pain, cough, diarrhea, earache, headache, sore throat, urination pain)     Headache, body ache, fullness in both ears 5. CAUSE: If there are no symptoms, ask: "What do you think is causing the fever?"      unsure 6.  CONTACTS: "Does anyone else in the family have an infection?"     no 7. TREATMENT: "What have you done so far to treat this fever?" (e.g., medications)     Nyquil and tylenol 8. IMMUNOCOMPROMISE: "Do you have of the following: diabetes, HIV positive, splenectomy, cancer chemotherapy, chronic steroid treatment, transplant patient, etc."     none  Answer Assessment - Initial Assessment Questions 1. LOCATION: "Which ear is involved?"       bilateral 2. SENSATION: "Describe how the ear feels." (e.g. stuffy, full, plugged)."      full 3. ONSET:  "When did the ear symptoms start?"       wednesday 4. PAIN: "Do you also have an earache?" If Yes, ask: "How bad is it?" (Scale 1-10; or mild, moderate, severe)     mild 5. CAUSE: "What do you think is causing the ear congestion?"     unsure 6. URI: "Do you have a runny nose or cough?"      Lowgrade fever, body aches, chills 7. NASAL ALLERGIES: "Are there symptoms of hay fever, such as sneezing or a clear nasal discharge?"     none  Protocols used: Fever-A-AH, Ear - Congestion-A-AH

## 2024-01-28 ENCOUNTER — Ambulatory Visit: Payer: Self-pay | Admitting: Family Medicine

## 2024-02-03 ENCOUNTER — Other Ambulatory Visit (INDEPENDENT_AMBULATORY_CARE_PROVIDER_SITE_OTHER): Payer: Self-pay | Admitting: Family Medicine

## 2024-02-03 DIAGNOSIS — I10 Essential (primary) hypertension: Secondary | ICD-10-CM

## 2024-02-05 ENCOUNTER — Ambulatory Visit (INDEPENDENT_AMBULATORY_CARE_PROVIDER_SITE_OTHER): Admitting: Family Medicine

## 2024-02-05 ENCOUNTER — Encounter (INDEPENDENT_AMBULATORY_CARE_PROVIDER_SITE_OTHER): Payer: Self-pay | Admitting: Family Medicine

## 2024-02-05 VITALS — BP 125/83 | HR 56 | Temp 98.4°F | Ht 62.0 in | Wt 184.0 lb

## 2024-02-05 DIAGNOSIS — I10 Essential (primary) hypertension: Secondary | ICD-10-CM

## 2024-02-05 DIAGNOSIS — E66812 Obesity, class 2: Secondary | ICD-10-CM

## 2024-02-05 DIAGNOSIS — Z6833 Body mass index (BMI) 33.0-33.9, adult: Secondary | ICD-10-CM | POA: Diagnosis not present

## 2024-02-05 DIAGNOSIS — E559 Vitamin D deficiency, unspecified: Secondary | ICD-10-CM

## 2024-02-05 MED ORDER — HYDROCHLOROTHIAZIDE 12.5 MG PO TABS: 12.5000 mg | ORAL_TABLET | Freq: Every day | ORAL | 0 refills | Status: DC

## 2024-02-05 MED ORDER — LISINOPRIL 5 MG PO TABS: 5.0000 mg | ORAL_TABLET | Freq: Every day | ORAL | 0 refills | Status: DC

## 2024-02-05 MED ORDER — ZEPBOUND 2.5 MG/0.5ML ~~LOC~~ SOAJ: 2.5000 mg | SUBCUTANEOUS | 0 refills | Status: DC

## 2024-02-05 MED ORDER — VITAMIN D (ERGOCALCIFEROL) 1.25 MG (50000 UNIT) PO CAPS
50000.0000 [IU] | ORAL_CAPSULE | ORAL | 0 refills | Status: DC
Start: 2024-02-05 — End: 2024-03-17

## 2024-02-05 MED ORDER — QSYMIA 15-92 MG PO CP24: 1.0000 | ORAL_CAPSULE | Freq: Every day | ORAL | 0 refills | Status: DC

## 2024-02-05 NOTE — Assessment & Plan Note (Signed)
 Blood pressure well controlled today.  No chest pain, chest pressure, headache.  Needs a refill of lisinopril  and hydrochlorothiazide  today; no change in dose or meds.

## 2024-02-05 NOTE — Assessment & Plan Note (Signed)
 On Qsymia  currently at 11.25-69 dose.  Significant cravings and hunger.  Will increase to max dose of Qsymia  today. Started makeshift qsymia  on 193 needed to loss 9.5lbs by mid December 2024 which she did.  PDMP checked- no concerns.

## 2024-02-05 NOTE — Progress Notes (Signed)
 SUBJECTIVE:  Chief Complaint: Obesity  Interim History: Patient can't really tell the difference with the qsymia  anymore. She has previously done wegovy  and saxenda .  She has been sticking to the meal plan fairly strictly over the last few weeks.  She did deviate a bit during Easter.  She has been eating less eggs and has occasionally had oatmeal.  Over the next few weeks she has quite a bit coming up.  She is going to [place]  [facility] May 1&2, 9 &10 graduation of her niece, party the following weekend then a conference in [place] .    Amos is here to discuss her progress with her obesity treatment plan. She is on the Category 1 Plan and states she is following her eating plan approximately 80-90 % of the time. She states she is exercising 20-30 minutes 4 times per week.   OBJECTIVE: Visit Diagnoses: Problem List Items Addressed This Visit       Cardiovascular and Mediastinum   Essential hypertension   Blood pressure well controlled today.  No chest pain, chest pressure, headache.  Needs a refill of lisinopril  and hydrochlorothiazide  today; no change in dose or meds.      Relevant Medications   lisinopril  (ZESTRIL ) 5 MG tablet   hydrochlorothiazide  (HYDRODIURIL ) 12.5 MG tablet     Other   Vitamin D  deficiency   Doing well on Vitamin D .  No nausea, vomiting or muscle weakness.  Needs a refill of Vitamin D  today.      Relevant Medications   Vitamin D , Ergocalciferol , (DRISDOL ) 1.25 MG (50000 UNIT) CAPS capsule   Class 2 severe obesity due to excess calories with serious comorbidity and body mass index (BMI) of 35.0 to 35.9 in adult [facility])   On Qsymia  currently at 11.25-69 dose.  Significant cravings and hunger.  Will increase to max dose of Qsymia  today. Started makeshift qsymia  on 193 needed to loss 9.5lbs by mid December 2024 which she did.  PDMP checked- no concerns.      Relevant Medications   Phentermine -Topiramate  (QSYMIA ) 15-92 MG CP24   Other Visit Diagnoses        Class 2 severe obesity with serious comorbidity and body mass index (BMI) of 35.0 to 35.9 in adult, unspecified obesity type (HCC)    -  Primary   Relevant Medications   Phentermine -Topiramate  (QSYMIA ) 15-92 MG CP24       Vitals Temp: 98.4 F (36.9 C) BP: 125/83 Pulse Rate: (!) 56 SpO2: 100 %   Anthropometric Measurements Height: 5\' 2"  (1.575 m) Weight: 184 lb (83.5 kg) BMI (Calculated): 33.65 Weight at Last Visit: 180 lb Weight Lost Since Last Visit: 0 Weight Gained Since Last Visit: 1 Starting Weight: 192 lb Total Weight Loss (lbs): 8 lb (3.629 kg)   Body Composition  Body Fat %: 44.3 % Fat Mass (lbs): 81.6 lbs Muscle Mass (lbs): 97.2 lbs Total Body Water (lbs): 71.8 lbs Visceral Fat Rating : 11   Other Clinical Data Today's Visit #: 12 Starting Date: 03/20/23 Comments: Cat 1     ASSESSMENT AND PLAN:  Diet: Aubryn is currently in the action stage of change. As such, her goal is to continue with weight loss efforts and has agreed to keeping a food journal and adhering to recommended goals of 1000-1100 calories and 80 or more grams protein daily.  Exercise:  For substantial health benefits, adults should do at least 150 minutes (2 hours and 30 minutes) a week of moderate-intensity, or 75 minutes (1 hour and  15 minutes) a week of vigorous-intensity aerobic physical activity, or an equivalent combination of moderate- and vigorous-intensity aerobic activity. Aerobic activity should be performed in episodes of at least 10 minutes, and preferably, it should be spread throughout the week.  Behavior Modification:  We discussed the following Behavioral Modification Strategies today: increasing lean protein intake, decreasing simple carbohydrates, meal planning and cooking strategies, planning for success, and keep a strict food journal.   No follow-ups on file.Aaron Aas She was informed of the importance of frequent follow up visits to maximize her success with intensive  lifestyle modifications for her multiple health conditions.  Attestation Statements:   Reviewed by clinician on day of visit: allergies, medications, problem list, medical history, surgical history, family history, social history, and previous encounter notes.   Donaciano Frizzle, MD

## 2024-02-05 NOTE — Assessment & Plan Note (Signed)
 Doing well on Vitamin D .  No nausea, vomiting or muscle weakness.  Needs a refill of Vitamin D  today.

## 2024-03-04 ENCOUNTER — Telehealth (INDEPENDENT_AMBULATORY_CARE_PROVIDER_SITE_OTHER): Payer: Self-pay

## 2024-03-04 NOTE — Telephone Encounter (Signed)
 PA for Zepbound   Your PA has been resolved, no additional PA is required. For further inquiries please contact the number on the back of the member prescription card.

## 2024-03-12 ENCOUNTER — Encounter (HOSPITAL_BASED_OUTPATIENT_CLINIC_OR_DEPARTMENT_OTHER): Payer: BC Managed Care – PPO | Admitting: Internal Medicine

## 2024-03-17 ENCOUNTER — Encounter (INDEPENDENT_AMBULATORY_CARE_PROVIDER_SITE_OTHER): Payer: Self-pay | Admitting: Family Medicine

## 2024-03-17 ENCOUNTER — Ambulatory Visit (INDEPENDENT_AMBULATORY_CARE_PROVIDER_SITE_OTHER): Admitting: Family Medicine

## 2024-03-17 VITALS — BP 117/77 | HR 72 | Temp 98.0°F | Ht 62.0 in | Wt 190.0 lb

## 2024-03-17 DIAGNOSIS — E559 Vitamin D deficiency, unspecified: Secondary | ICD-10-CM | POA: Diagnosis not present

## 2024-03-17 DIAGNOSIS — Z6834 Body mass index (BMI) 34.0-34.9, adult: Secondary | ICD-10-CM | POA: Diagnosis not present

## 2024-03-17 DIAGNOSIS — I10 Essential (primary) hypertension: Secondary | ICD-10-CM

## 2024-03-17 DIAGNOSIS — E66812 Obesity, class 2: Secondary | ICD-10-CM | POA: Diagnosis not present

## 2024-03-17 MED ORDER — LISINOPRIL 5 MG PO TABS: 5.0000 mg | ORAL_TABLET | Freq: Every day | ORAL | 0 refills | Status: DC

## 2024-03-17 MED ORDER — VITAMIN D (ERGOCALCIFEROL) 1.25 MG (50000 UNIT) PO CAPS: 50000.0000 [IU] | ORAL_CAPSULE | ORAL | 0 refills | Status: DC

## 2024-03-17 MED ORDER — HYDROCHLOROTHIAZIDE 12.5 MG PO TABS: 12.5000 mg | ORAL_TABLET | Freq: Every day | ORAL | 0 refills | Status: DC

## 2024-03-17 NOTE — Progress Notes (Signed)
 SUBJECTIVE:  Chief Complaint: Obesity  Interim History: Patient not able to start new dose of Qsymia  due to prior authorization not done.  She knew she gained due to clothes fitting tighter and her appetite returning.  Has questions about whether or not insurance will cover Zepbound . She needs to call for coverage clarification.   Patient has no upcoming plans for the month of June.  She has desire to do more and be more consistent especially with activity.    Crystal Shea is here to discuss her progress with her obesity treatment plan. She is on the keeping a food journal and adhering to recommended goals of 1000-1100 calories and 80 grams of protein and states she is following her eating plan approximately 50 % of the time. She states she is exercising 15 minutes 5 times per week.   OBJECTIVE: Visit Diagnoses: Problem List Items Addressed This Visit       Cardiovascular and Mediastinum   Essential hypertension - Primary   On lisinopril  and hydrochlorothiazide  with good control of BP.  Needs refill of meds today.  No chest pain, chest pressure, or headache mentioned today.        Relevant Medications   lisinopril  (ZESTRIL ) 5 MG tablet   hydrochlorothiazide  (HYDRODIURIL ) 12.5 MG tablet     Other   Vitamin D  deficiency   On prescription strength Vitamin D  which she needs a refill of.  No nausea, vomiting or muscle weakness.  Refill sent in to pharmacy.      Relevant Medications   Vitamin D , Ergocalciferol , (DRISDOL ) 1.25 MG (50000 UNIT) CAPS capsule   Class 2 severe obesity due to excess calories with serious comorbidity and body mass index (BMI) of 35.0 to 35.9 in adult [facility])   Anthropometric Measurements Height: 5\' 2"  (1.575 m) Weight: 190 lb (86.2 kg) BMI (Calculated): 34.74 Weight at Last Visit: 184 lb Weight Lost Since Last Visit: 0 Weight Gained Since Last Visit: 6 Starting Weight: 192 lb Total Weight Loss (lbs): 2 lb (0.907 kg) Body Composition  Body Fat %: 44.3 % Fat  Mass (lbs): 84.6 lbs Muscle Mass (lbs): 100.8 lbs Total Body Water (lbs): 73.2 lbs Visceral Fat Rating : 12 Other Clinical Data Today's Visit #: 13 Starting Date: 03/20/23 Comments: Cat 1       Other Visit Diagnoses       BMI 34.0-34.9,adult           No data recorded       03/17/2024    3:00 PM 02/05/2024    3:00 PM 01/01/2024    2:00 PM  Vitals with BMI  Height 5\' 2"  5\' 2"  5\' 2"   Weight 190 lbs 184 lbs 183 lbs  BMI 34.74 33.65 33.46  Systolic 117 125 161  Diastolic 77 83 73  Pulse 72 56 65      ASSESSMENT AND PLAN:  Diet: Tayte is currently in the action stage of change. As such, her goal is to continue with weight loss efforts and has agreed to keeping a food journal and adhering to recommended goals of 1000-1100 calories and 80 or more grams of protein daily.  Patient to start food log or journaling meal plan.  The initial goal will be to habitually log or journal for at least 4 days a week.  The expectation it that patient may not initially meet calorie or protein goals as the nturitional understanding of food intake is begun.  We discussed the 10:1 ratio when reading a food label.  Patient agrees to keep a food log either electronically or on paper and bring to the next appointment to be able to dissect and discuss it with provider.    Exercise:  For substantial health benefits, adults should do at least 150 minutes (2 hours and 30 minutes) a week of moderate-intensity, or 75 minutes (1 hour and 15 minutes) a week of vigorous-intensity aerobic physical activity, or an equivalent combination of moderate- and vigorous-intensity aerobic activity. Aerobic activity should be performed in episodes of at least 10 minutes, and preferably, it should be spread throughout the week.  She is looking for a treadmill that she can get used.  Behavior Modification:  We discussed the following Behavioral Modification Strategies today: increasing lean protein intake, decreasing  simple carbohydrates, increasing vegetables, meal planning and cooking strategies, keeping healthy foods in the home, and keep a strict food journal.   No follow-ups on file.   She was informed of the importance of frequent follow up visits to maximize her success with intensive lifestyle modifications for her multiple health conditions.  Attestation Statements:   Reviewed by clinician on day of visit: allergies, medications, problem list, medical history, surgical history, family history, social history, and previous encounter notes.     Donaciano Frizzle, MD

## 2024-03-24 NOTE — Assessment & Plan Note (Signed)
 Anthropometric Measurements Height: 5\' 2"  (1.575 m) Weight: 190 lb (86.2 kg) BMI (Calculated): 34.74 Weight at Last Visit: 184 lb Weight Lost Since Last Visit: 0 Weight Gained Since Last Visit: 6 Starting Weight: 192 lb Total Weight Loss (lbs): 2 lb (0.907 kg) Body Composition  Body Fat %: 44.3 % Fat Mass (lbs): 84.6 lbs Muscle Mass (lbs): 100.8 lbs Total Body Water (lbs): 73.2 lbs Visceral Fat Rating : 12 Other Clinical Data Today's Visit #: 13 Starting Date: 03/20/23 Comments: Cat 1

## 2024-03-24 NOTE — Assessment & Plan Note (Signed)
 On lisinopril  and hydrochlorothiazide  with good control of BP.  Needs refill of meds today.  No chest pain, chest pressure, or headache mentioned today.

## 2024-03-24 NOTE — Assessment & Plan Note (Signed)
 On prescription strength Vitamin D  which she needs a refill of.  No nausea, vomiting or muscle weakness.  Refill sent in to pharmacy.

## 2024-03-25 ENCOUNTER — Encounter (INDEPENDENT_AMBULATORY_CARE_PROVIDER_SITE_OTHER): Payer: Self-pay | Admitting: Family Medicine

## 2024-03-26 NOTE — Telephone Encounter (Signed)
PA submitted for Qsymia:

## 2024-04-17 ENCOUNTER — Other Ambulatory Visit (HOSPITAL_COMMUNITY): Payer: Self-pay

## 2024-04-21 ENCOUNTER — Encounter: Payer: Self-pay | Admitting: Family Medicine

## 2024-04-21 ENCOUNTER — Ambulatory Visit: Payer: Self-pay | Admitting: Family Medicine

## 2024-04-21 ENCOUNTER — Ambulatory Visit: Payer: Self-pay | Admitting: *Deleted

## 2024-04-21 ENCOUNTER — Ambulatory Visit (INDEPENDENT_AMBULATORY_CARE_PROVIDER_SITE_OTHER): Admitting: Family Medicine

## 2024-04-21 VITALS — BP 124/78 | HR 62 | Temp 96.7°F | Ht 62.0 in | Wt 200.6 lb

## 2024-04-21 DIAGNOSIS — M79645 Pain in left finger(s): Secondary | ICD-10-CM

## 2024-04-21 LAB — CBC WITH DIFFERENTIAL/PLATELET
Basophils Absolute: 0 K/uL (ref 0.0–0.1)
Basophils Relative: 0.5 % (ref 0.0–3.0)
Eosinophils Absolute: 0.1 K/uL (ref 0.0–0.7)
Eosinophils Relative: 2.5 % (ref 0.0–5.0)
HCT: 37.9 % (ref 36.0–46.0)
Hemoglobin: 12.5 g/dL (ref 12.0–15.0)
Lymphocytes Relative: 32.1 % (ref 12.0–46.0)
Lymphs Abs: 1.6 K/uL (ref 0.7–4.0)
MCHC: 33 g/dL (ref 30.0–36.0)
MCV: 88 fl (ref 78.0–100.0)
Monocytes Absolute: 0.4 K/uL (ref 0.1–1.0)
Monocytes Relative: 7.4 % (ref 3.0–12.0)
Neutro Abs: 2.9 K/uL (ref 1.4–7.7)
Neutrophils Relative %: 57.5 % (ref 43.0–77.0)
Platelets: 206 K/uL (ref 150.0–400.0)
RBC: 4.31 Mil/uL (ref 3.87–5.11)
RDW: 13.9 % (ref 11.5–15.5)
WBC: 5.1 K/uL (ref 4.0–10.5)

## 2024-04-21 LAB — URIC ACID: Uric Acid, Serum: 4.2 mg/dL (ref 2.4–7.0)

## 2024-04-21 MED ORDER — INDOMETHACIN 50 MG PO CAPS: 50.0000 mg | ORAL_CAPSULE | Freq: Three times a day (TID) | ORAL | 0 refills | Status: DC

## 2024-04-21 NOTE — Progress Notes (Signed)
 Established Patient Office Visit   Subjective:  Patient ID: Crystal Shea, female    DOB: 02-05-1971  Age: 53 y.o. MRN: [id number]  Chief Complaint  Patient presents with   Hand Pain    Left index finger pain and edema. Pt went to Urgent care on yesterday and was prescribed oxycycline. And xrays were done. [facility].     HPI Encounter Diagnoses  Name Primary?   Pain in finger of left hand Yes   2-day history of spontaneous pain and swelling in her left second PIP.  No injury history.  Vacation this past week.  No history of gout.  Was seen in urgent care 2 days ago.  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  Suggestion was made regarding the possibility as a sequela of distant trauma.  Patient has no history of trauma to this joint that she can remember.  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   Review of Systems  Constitutional: Negative.  Negative for chills and fever.  HENT: Negative.    Eyes:  Negative for blurred vision, discharge and redness.  Respiratory: Negative.    Cardiovascular: Negative.   Gastrointestinal:  Negative for abdominal pain.  Genitourinary: Negative.   Musculoskeletal:  Positive for joint pain. Negative for myalgias.  Skin:  Negative for rash.  Neurological:  Negative for tingling, loss of consciousness and weakness.  Endo/Heme/Allergies:  Negative for polydipsia.     Current Outpatient Medications:    doxycycline (VIBRA-TABS) 100 MG tablet, Take 100 mg by mouth 2 (two) times daily., Disp: , Rfl:    indomethacin  (INDOCIN ) 50 MG capsule, Take 1 capsule (50 mg total) by mouth 3 (three) times daily with meals., Disp: 30 capsule, Rfl: 0   atorvastatin  (LIPITOR) 40 MG tablet, TAKE 1 TABLET BY MOUTH EVERY DAY, Disp: 90 tablet, Rfl: 3   cetirizine (ZYRTEC) 10 MG tablet, Take 10-20 mg by mouth daily as needed for allergies., Disp: , Rfl:     cyclobenzaprine (FLEXERIL) 5 MG tablet, Take by mouth., Disp: , Rfl:    Evolocumab  (REPATHA  SURECLICK) 140 MG/ML SOAJ, Inject 140 mg into the skin every 14 (fourteen) days., Disp: 2 mL, Rfl: 11   ezetimibe  (ZETIA ) 10 MG tablet, TAKE 1 TABLET (10 MG TOTAL) BY MOUTH DAILY AFTER SUPPER., Disp: 90 tablet, Rfl: 3   fluticasone (FLONASE) 50 MCG/ACT nasal spray, Place into both nostrils daily., Disp: , Rfl:    hydrochlorothiazide  (HYDRODIURIL ) 12.5 MG tablet, Take 1 tablet (12.5 mg total) by mouth daily., Disp: 30 tablet, Rfl: 0   lisinopril  (ZESTRIL ) 5 MG tablet, Take 1 tablet (5 mg total) by mouth daily., Disp: 30 tablet, Rfl: 0   Olopatadine  HCl 0.2 % SOLN, Pataday  0.2 % eye drops  PLACE ONE DROP INTO BOTH EYES DAILY. (Patient taking differently: Place 1 drop into both eyes daily as needed (allergies).), Disp: 2.5 mL, Rfl: 11   Phentermine -Topiramate  (QSYMIA ) 15-92 MG CP24, Take 1 capsule by mouth daily., Disp: 30 capsule, Rfl: 0   Tetrahydrozoline HCl (VISINE OP), Place 1 drop into both eyes daily. For redness, Disp: , Rfl:    tirzepatide  (ZEPBOUND ) 2.5 MG/0.5ML Pen, Inject 2.5 mg into the skin once a week., Disp: 2 mL, Rfl: 0   Vitamin D , Ergocalciferol , (DRISDOL ) 1.25 MG (50000 UNIT) CAPS capsule, Take 1 capsule (50,000 Units total) by mouth every 7 (seven) days., Disp: 12  capsule, Rfl: 0   Objective:     BP 124/78 (Cuff Size: Large)   Pulse 62   Temp (!) 96.7 F (35.9 C) (Temporal)   Ht 5' 2 (1.575 m)   Wt 200 lb 9.6 oz (91 kg)   LMP 02/03/2015 (Approximate)   SpO2 99%   BMI 36.69 kg/m    Physical Exam Constitutional:      General: She is not in acute distress.    Appearance: Normal appearance. She is not ill-appearing, toxic-appearing or diaphoretic.  HENT:     Head: Normocephalic and atraumatic.     Right Ear: External ear normal.     Left Ear: External ear normal.  Eyes:     General: No scleral icterus.       Right eye: No discharge.        Left eye: No discharge.      Extraocular Movements: Extraocular movements intact.     Conjunctiva/sclera: Conjunctivae normal.  Pulmonary:     Effort: Pulmonary effort is normal. No respiratory distress.  Musculoskeletal:     Left hand: Swelling and tenderness present. Decreased range of motion.       Arms:  Skin:    General: Skin is warm and dry.  Neurological:     Mental Status: She is alert and oriented to person, place, and time.  Psychiatric:        Mood and Affect: Mood normal.        Behavior: Behavior normal.      No results found for any visits on 04/21/24.    The 10-year ASCVD risk score (Arnett DK, et al., 2019) is: 3.1%    Assessment & Plan:   Pain in finger of left hand -     CBC with Differential/Platelet -     Uric acid -     Ambulatory referral to Sports Medicine -     Indomethacin ; Take 1 capsule (50 mg total) by mouth 3 (three) times daily with meals.  Dispense: 30 capsule; Refill: 0    Return Referral to sports medicine..  Hold ibuprofen.  Start Indocin .  Sports medicine referral.  Out of work through Wednesday.  Elsie Sim Lent, MD

## 2024-04-21 NOTE — Telephone Encounter (Signed)
 Scheduled with another provider.Dm/cma

## 2024-04-21 NOTE — Telephone Encounter (Signed)
 Copied from CRM [id number]. Topic: Clinical - Red Word Triage >> Apr 21, 2024  8:06 AM Thersia BROCKS wrote: [facility] that prompted transfer to Nurse Triage: index finger is swollen, and is in a whole lot of pain   ----------------------------------------------------------------------- From previous Reason for Contact - Scheduling: Patient/patient representative is calling to schedule an appointment. Refer to attachments for appointment information. Reason for Disposition  [1] MODERATE pain (e.g., interferes with normal activities) AND [2] present > 3 days  Answer Assessment - Initial Assessment Questions 1. ONSET: When did the pain start?      I'm having pain in left index finger.   I woke up Sat. Morning with pain.  It's getting worse.   I went to urgent care yesterday.  She x rayed it.   It was broken in the past and healed   She gave me an rx.   It's not helping.   I've been up all night.    2. LOCATION and RADIATION: Where is the pain located?  (e.g., fingertip, around nail, joint, entire  finger)      It's swollen and feels like it's going burst.   No open wounds.  It's really swollen.    3. SEVERITY: How bad is the pain? What does it keep you from doing?   (Scale 1-10; or mild, moderate, severe)  - MILD (1-3): doesn't interfere with normal activities.   - MODERATE (4-7): interferes with normal activities or awakens from sleep.  - SEVERE (8-10): excruciating pain, unable to hold a glass of water or bend finger even a little.     Severe   4. APPEARANCE: What does the finger look like? (e.g., redness, swelling, bruising, pallor)     It's red and swollen.  I can't bend it. 5. WORK OR EXERCISE: Has there been any recent work or exercise that involved this part (i.e., fingers or hand) of the body?     No 6. CAUSE: What do you think is causing the pain?     I don't know 7. AGGRAVATING FACTORS: What makes the pain worse? (e.g., using computer)     Moving it 8. OTHER SYMPTOMS: Do  you have any other symptoms? (e.g., fever, neck pain, numbness)     No 9. PREGNANCY: Is there any chance you are pregnant? When was your last menstrual period?     Not asked  Protocols used: Finger Pain-A-AH FYI Only or Action Required?: FYI only for provider.  Patient was last seen in primary care on 03/17/2024 by Berkeley Adelita PENNER, MD. Called Nurse Triage reporting Hand Pain. Symptoms began several days ago. Interventions attempted: OTC medications: Motrin. Symptoms are: gradually worsening.  Triage Disposition: See PCP When Office is Open (Within 3 Days)  Patient/caregiver understands and will follow disposition?: Yes

## 2024-04-22 ENCOUNTER — Encounter (INDEPENDENT_AMBULATORY_CARE_PROVIDER_SITE_OTHER): Payer: Self-pay | Admitting: Family Medicine

## 2024-04-22 ENCOUNTER — Other Ambulatory Visit: Payer: Self-pay

## 2024-04-22 ENCOUNTER — Other Ambulatory Visit (HOSPITAL_BASED_OUTPATIENT_CLINIC_OR_DEPARTMENT_OTHER): Payer: Self-pay

## 2024-04-22 ENCOUNTER — Ambulatory Visit (INDEPENDENT_AMBULATORY_CARE_PROVIDER_SITE_OTHER): Admitting: Family Medicine

## 2024-04-22 ENCOUNTER — Emergency Department (HOSPITAL_BASED_OUTPATIENT_CLINIC_OR_DEPARTMENT_OTHER)
Admission: EM | Admit: 2024-04-22 | Discharge: 2024-04-22 | Disposition: A | Discharge status: Home / Self Care | Attending: Emergency Medicine | Admitting: Emergency Medicine

## 2024-04-22 ENCOUNTER — Encounter (HOSPITAL_BASED_OUTPATIENT_CLINIC_OR_DEPARTMENT_OTHER): Payer: Self-pay

## 2024-04-22 ENCOUNTER — Emergency Department (HOSPITAL_BASED_OUTPATIENT_CLINIC_OR_DEPARTMENT_OTHER): Admitting: Radiology

## 2024-04-22 VITALS — BP 143/76 | HR 60 | Temp 98.3°F | Ht 62.0 in | Wt 196.0 lb

## 2024-04-22 DIAGNOSIS — F5089 Other specified eating disorder: Secondary | ICD-10-CM | POA: Diagnosis not present

## 2024-04-22 DIAGNOSIS — I509 Heart failure, unspecified: Secondary | ICD-10-CM | POA: Insufficient documentation

## 2024-04-22 DIAGNOSIS — M79642 Pain in left hand: Secondary | ICD-10-CM | POA: Insufficient documentation

## 2024-04-22 DIAGNOSIS — M79645 Pain in left finger(s): Secondary | ICD-10-CM

## 2024-04-22 DIAGNOSIS — E785 Hyperlipidemia, unspecified: Secondary | ICD-10-CM

## 2024-04-22 DIAGNOSIS — I10 Essential (primary) hypertension: Secondary | ICD-10-CM | POA: Diagnosis not present

## 2024-04-22 DIAGNOSIS — E66812 Obesity, class 2: Secondary | ICD-10-CM

## 2024-04-22 DIAGNOSIS — E7800 Pure hypercholesterolemia, unspecified: Secondary | ICD-10-CM

## 2024-04-22 DIAGNOSIS — Z6835 Body mass index (BMI) 35.0-35.9, adult: Secondary | ICD-10-CM

## 2024-04-22 LAB — CBC WITH DIFFERENTIAL/PLATELET
Abs Immature Granulocytes: 0.01 K/uL (ref 0.00–0.07)
Basophils Absolute: 0 K/uL (ref 0.0–0.1)
Basophils Relative: 0 %
Eosinophils Absolute: 0.1 K/uL (ref 0.0–0.5)
Eosinophils Relative: 1 %
HCT: 37.8 % (ref 36.0–46.0)
Hemoglobin: 12.5 g/dL (ref 12.0–15.0)
Immature Granulocytes: 0 %
Lymphocytes Relative: 35 %
Lymphs Abs: 1.9 K/uL (ref 0.7–4.0)
MCH: 28.8 pg (ref 26.0–34.0)
MCHC: 33.1 g/dL (ref 30.0–36.0)
MCV: 87.1 fL (ref 80.0–100.0)
Monocytes Absolute: 0.4 K/uL (ref 0.1–1.0)
Monocytes Relative: 7 %
Neutro Abs: 3.1 K/uL (ref 1.7–7.7)
Neutrophils Relative %: 57 %
Platelets: 204 K/uL (ref 150–400)
RBC: 4.34 MIL/uL (ref 3.87–5.11)
RDW: 13.1 % (ref 11.5–15.5)
WBC: 5.5 K/uL (ref 4.0–10.5)
nRBC: 0 % (ref 0.0–0.2)

## 2024-04-22 LAB — COMPREHENSIVE METABOLIC PANEL WITH GFR
ALT: 21 U/L (ref 0–44)
AST: 22 U/L (ref 15–41)
Albumin: 3.7 g/dL (ref 3.5–5.0)
Alkaline Phosphatase: 112 U/L (ref 38–126)
Anion gap: 10 (ref 5–15)
BUN: 14 mg/dL (ref 6–20)
CO2: 24 mmol/L (ref 22–32)
Calcium: 9.1 mg/dL (ref 8.9–10.3)
Chloride: 106 mmol/L (ref 98–111)
Creatinine, Ser: 0.83 mg/dL (ref 0.44–1.00)
GFR, Estimated: >60 mL/min (ref >60)
Glucose, Bld: 99 mg/dL (ref 70–99)
Potassium: 4 mmol/L (ref 3.5–5.1)
Sodium: 140 mmol/L (ref 135–145)
Total Bilirubin: 0.6 mg/dL (ref 0.0–1.2)
Total Protein: 7.4 g/dL (ref 6.5–8.1)

## 2024-04-22 MED ORDER — LISINOPRIL 5 MG PO TABS
5.0000 mg | ORAL_TABLET | Freq: Every day | ORAL | 0 refills | Status: DC
Start: 2024-04-22 — End: 2024-08-05

## 2024-04-22 MED ORDER — OXYCODONE HCL 5 MG PO TABS
5.0000 mg | ORAL_TABLET | Freq: Four times a day (QID) | ORAL | 0 refills | Status: DC | PRN
  Filled 2024-04-22: qty 10, 3d supply, fill #0

## 2024-04-22 MED ORDER — HYDROCHLOROTHIAZIDE 12.5 MG PO TABS: 12.5000 mg | ORAL_TABLET | Freq: Every day | ORAL | 0 refills | Status: DC

## 2024-04-22 MED ORDER — TOPIRAMATE 100 MG PO TABS: 100.0000 mg | ORAL_TABLET | Freq: Every day | ORAL | 0 refills | Status: DC

## 2024-04-22 MED ORDER — EZETIMIBE 10 MG PO TABS
10.0000 mg | ORAL_TABLET | Freq: Every day | ORAL | 0 refills | Status: DC
Start: 2024-04-22 — End: 2024-08-05

## 2024-04-22 MED ORDER — OXYCODONE HCL 5 MG PO TABS
5.0000 mg | ORAL_TABLET | Freq: Once | ORAL | Status: AC
  Administered 2024-04-22: 5 mg via ORAL
  Filled 2024-04-22: qty 1

## 2024-04-22 MED ORDER — LOMAIRA 8 MG PO TABS: 12.0000 mg | ORAL_TABLET | Freq: Every day | ORAL | 0 refills | Status: DC

## 2024-04-22 NOTE — ED Provider Notes (Signed)
 [place] EMERGENCY DEPARTMENT AT [facility] Provider Note   CSN: [id number] Arrival date & time: 04/22/24  1314     Patient presents with: Hand Problem (Swelling)   Crystal Shea is a 53 y.o. female.   Patient here with ongoing pain to her left index finger and now some swelling to the left index finger knuckle and middle finger knuckle.  No prior injury or trauma to this area.  She has been on indomethacin  and doxycycline for the last 2 days with minimal improvement.  Denies any fevers or chills.  She has had prior surgery to the left index finger.  She works as a Emergency planning/management officer.  Patient has high cholesterol history of CHF.  She is not on any immunocompromising medications.  Swelling mostly on the back of her finger.  The history is provided by the patient.       Prior to Admission medications   Medication Sig Start Date End Date Taking? Authorizing Provider  oxyCODONE  (ROXICODONE ) 5 MG immediate release tablet Take 1 tablet (5 mg total) by mouth every 6 (six) hours as needed for up to 10 doses. 04/22/24  Yes Makara Lanzo, DO  atorvastatin  (LIPITOR) 40 MG tablet TAKE 1 TABLET BY MOUTH EVERY DAY 03/09/23   Thedora Garnette HERO, MD  cetirizine (ZYRTEC) 10 MG tablet Take 10-20 mg by mouth daily as needed for allergies. 01/24/17   [provider]  cyclobenzaprine (FLEXERIL) 5 MG tablet Take by mouth. 10/06/22   [provider]  Evolocumab  (REPATHA  SURECLICK) 140 MG/ML SOAJ Inject 140 mg into the skin every 14 (fourteen) days. 11/28/23   Hilty, Vinie BROCKS, MD  ezetimibe  (ZETIA ) 10 MG tablet Take 1 tablet (10 mg total) by mouth daily after supper. 04/22/24 04/17/25  Berkeley Adelita PENNER, MD  fluticasone (FLONASE) 50 MCG/ACT nasal spray Place into both nostrils daily.    [provider]  hydrochlorothiazide  (HYDRODIURIL ) 12.5 MG tablet Take 1 tablet (12.5 mg total) by mouth daily. 04/22/24   Berkeley Adelita PENNER, MD  indomethacin  (INDOCIN ) 50 MG capsule Take 1  capsule (50 mg total) by mouth 3 (three) times daily with meals. 04/21/24   Berneta Elsie Sayre, MD  lisinopril  (ZESTRIL ) 5 MG tablet Take 1 tablet (5 mg total) by mouth daily. 04/22/24   Berkeley Adelita PENNER, MD  Olopatadine  HCl 0.2 % SOLN Pataday  0.2 % eye drops  PLACE ONE DROP INTO BOTH EYES DAILY. Patient taking differently: Place 1 drop into both eyes daily as needed (allergies). 10/28/21   Thedora Garnette HERO, MD  Phentermine  HCl (LOMAIRA ) 8 MG TABS Take 1.5 tablets (12 mg total) by mouth daily. 04/22/24   Berkeley Adelita PENNER, MD  Phentermine -Topiramate  (QSYMIA ) 15-92 MG CP24 Take 1 capsule by mouth daily. 02/05/24   Berkeley Adelita PENNER, MD  Tetrahydrozoline HCl (VISINE OP) Place 1 drop into both eyes daily. For redness    [provider]  topiramate  (TOPAMAX ) 100 MG tablet Take 1 tablet (100 mg total) by mouth daily. 04/22/24   Berkeley Adelita PENNER, MD  Vitamin D , Ergocalciferol , (DRISDOL ) 1.25 MG (50000 UNIT) CAPS capsule Take 1 capsule (50,000 Units total) by mouth every 7 (seven) days. 03/17/24   Berkeley Adelita PENNER, MD    Allergies: Anesthesia s-i-40 [propofol]    Review of Systems  Updated Vital Signs BP (!) 175/74 (BP Location: Right Arm)   Pulse (!) 56   Temp 97.8 F (36.6 C)   Resp 18   LMP 02/03/2015 (Approximate)   SpO2 99%  Physical Exam Vitals and nursing note reviewed.  Constitutional:      General: She is not in acute distress.    Appearance: She is well-developed.  HENT:     Head: Normocephalic and atraumatic.  Eyes:     Conjunctiva/sclera: Conjunctivae normal.  Cardiovascular:     Rate and Rhythm: Normal rate and regular rhythm.     Heart sounds: No murmur heard. Pulmonary:     Effort: Pulmonary effort is normal. No respiratory distress.     Breath sounds: Normal breath sounds.  Abdominal:     Palpations: Abdomen is soft.     Tenderness: There is no abdominal tenderness.  Musculoskeletal:        General: Swelling and tenderness present.     Cervical  back: Neck supple.     Comments: Tenderness and swelling to the MCP of the left index and middle finger, she is tender in the back of her left index finger, there is no swelling of the flexor portion of her fingers or hand, decreased range of motion in these fingers secondary to pain  Skin:    General: Skin is warm and dry.     Capillary Refill: Capillary refill takes less than 2 seconds.  Neurological:     Mental Status: She is alert.     Sensory: No sensory deficit.     Motor: No weakness.  Psychiatric:        Mood and Affect: Mood normal.     (all labs ordered are listed, but only abnormal results are displayed) Labs Reviewed  COMPREHENSIVE METABOLIC PANEL WITH GFR  CBC WITH DIFFERENTIAL/PLATELET    EKG: None  Radiology: DG Hand Complete Left Result Date: 04/22/2024 CLINICAL DATA:  Left index finger swelling for several days. No known injury. EXAM: LEFT HAND - COMPLETE 3+ VIEW COMPARISON:  December 18, 2003. FINDINGS: Status post surgical internal fixation of old fifth metacarpal fracture. No dislocation is noted. Large rounded calcific density is seen volar to the proximal interphalangeal joint of the index finger; etiology is unknown, but potentially may be postsurgical or degenerative in etiology. Irregular small lucency is seen extending through it concerning for possible fracture of indeterminate age if this does represent bone. IMPRESSION: Postsurgical changes seen involving fifth metacarpal as noted above. Large rounded calcific density is seen volar to proximal interphalangeal joint of index finger of uncertain etiology. Potentially may be postsurgical or degenerative in etiology. Correlation with patient's history is recommended. Small irregular linear density is noted within this abnormality which may represent possible nondisplaced fracture of indeterminate age if this does represent bony structure. Electronically Signed   By: Lynwood Landy Raddle M.D.   On: 04/22/2024 15:45      Procedures   Medications Ordered in the ED - No data to display                                  Medical Decision Making Amount and/or Complexity of Data Reviewed Labs: ordered. Radiology: ordered.  Risk Prescription drug management.   Verneta JONELLE Shove she is here with swelling to the back of her left hand.  Mostly her left index finger and the knuckle of her left middle finger.  She has history of CHF.  She has had prior surgery to her pinky finger in this hand.  No known trauma or prior injury to the index and middle finger.  Has been on doxycycline and indomethacin  for  2 days with minimal improvement.  She has had no fever.  She has no fever here.  No white count.  No abnormalities on her labs per my review and interpretation.  X-rays do show a large rounded calcific density on the volar portion of her index finger.  She is never had surgery there.  I do think that this is likely degenerative or may be from her prior trauma.  Ultimately I do not think there is an infectious process going on.  There is no fluctuance or major erythema or warmth.  I think that this is likely an inflammatory process from degenerative changes or may be prior injury.  She is neurovascular neuromuscular intact on exam.  Looks like may be a bone spur on x-ray.  She has no evidence of flexor tenosynovitis on exam.  Ultimately will place her in a volar splint for support and pain improvement.  Recommend ice, continue her antibiotics and indomethacin  will write for Roxicodone  for breakthrough pain and refer her to hand.  Told to return if she develop fever or worsening symptoms.  Discharged in good condition.  This chart was dictated using voice recognition software.  Despite best efforts to proofread,  errors can occur which can change the documentation meaning.      Final diagnoses:  Pain of left hand    ED Discharge Orders          Ordered    oxyCODONE  (ROXICODONE ) 5 MG immediate release tablet  Every 6  hours PRN        04/22/24 1718               Ruthe Cornet, DO 04/22/24 1722

## 2024-04-22 NOTE — Progress Notes (Signed)
 SUBJECTIVE:  Chief Complaint: Obesity  Interim History: Patient reports that she is dealing with swelling of her finger and that is slowly spreading to her hand.  She mentions she does not recall any trauma or incident with that finger and hand prior to the appearance of the swelling.  Her sister died of sepsis and she is concerned that she has an underlying infectious process that could be going on right now.  Crystal Shea is here to discuss her progress with her obesity treatment plan. She is on the keeping a food journal and adhering to recommended goals of 1000-1100 calories and 80 grams of protein and states she is following her eating plan approximately 25 % of the time. She states she is walking 30 minutes 2 times per week.   OBJECTIVE: Visit Diagnoses: Problem List Items Addressed This Visit       Cardiovascular and Mediastinum   Essential hypertension   Relevant Medications   ezetimibe  (ZETIA ) 10 MG tablet   lisinopril  (ZESTRIL ) 5 MG tablet   hydrochlorothiazide  (HYDRODIURIL ) 12.5 MG tablet     Other   Hyperlipidemia   Relevant Medications   ezetimibe  (ZETIA ) 10 MG tablet   lisinopril  (ZESTRIL ) 5 MG tablet   hydrochlorothiazide  (HYDRODIURIL ) 12.5 MG tablet   Disorder of eating   Relevant Medications   topiramate  (TOPAMAX ) 100 MG tablet   Phentermine  HCl (LOMAIRA ) 8 MG TABS   Class 2 severe obesity due to excess calories with serious comorbidity and body mass index (BMI) of 35.0 to 35.9 in adult [facility])   Relevant Medications   Phentermine  HCl (LOMAIRA ) 8 MG TABS   Other Visit Diagnoses       Pain in finger of left hand    -  Primary     BMI 35.0-35.9,adult           No data recorded       ASSESSMENT AND PLAN: Assessment & Plan Pain in finger of left hand Patient left hand with swelling of the dorsal aspect and to PIP of 2nd digit.  No erythema or systemic symptoms.  Patient encouraged to seek evaluation from ED or UC for further management as no improvement  from treatment options given to her previously.  Other disorder of eating Patient started makeshift qsymia  at 193lbs.  She is at 196 today so no net loss.  Will continue doses to 12mg  of Lomaira  and 75mg  of Topiramate .  Re-evaluate dose and response at next appointment. Pure hypercholesterolemia The 10-year ASCVD risk score (Arnett DK, et al., 2019) is: 5.8%   Values used to calculate the score:     Age: 53 years     Clincally relevant sex: Female     Is Non-Hispanic African American: Yes     Diabetic: No     Tobacco smoker: No     Systolic Blood Pressure: 146 mmHg     Is BP treated: Yes     HDL Cholesterol: 51 mg/dL     Total Cholesterol: 167 mg/dL Patient is on lipitor 40mg  daily.  She is working on lifestyle changes to limit saturated fat intake. Essential hypertension Blood pressure slightly elevated but patient in pain due to hand.  Will make no changes in BP management but will follow up on BP at next appointment.  No chest pain, chest pressure or headache. Class 2 severe obesity due to excess calories with serious comorbidity and body mass index (BMI) of 35.0 to 35.9 in adult [facility]) Anthropometric Measurements Height: 5' 2 (1.575  m) Weight: 196 lb (88.9 kg) BMI (Calculated): 35.84 Weight at Last Visit: 190 lb Weight Lost Since Last Visit: 0 Weight Gained Since Last Visit: 6 Starting Weight: 192 lb Total Weight Loss (lbs): 0 lb (0 kg) Body Composition  Body Fat %: 44.3 % Fat Mass (lbs): 87 lbs Muscle Mass (lbs): 103.6 lbs Total Body Water (lbs): 72.2 lbs Visceral Fat Rating : 12 Other Clinical Data Today's Visit #: 14 Starting Date: 03/20/23 Comments: 1000-1100/80  BMI 35.0-35.9,adult    Diet: [place] is currently in the action stage of change. As such, her goal is to continue with weight loss efforts and has agreed to keeping a food journal and adhering to recommended goals of 1000-1100 calories and 80 or more grams protein daily.   Exercise:  For substantial  health benefits, adults should do at least 150 minutes (2 hours and 30 minutes) a week of moderate-intensity, or 75 minutes (1 hour and 15 minutes) a week of vigorous-intensity aerobic physical activity, or an equivalent combination of moderate- and vigorous-intensity aerobic activity. Aerobic activity should be performed in episodes of at least 10 minutes, and preferably, it should be spread throughout the week.  Behavior Modification:  We discussed the following Behavioral Modification Strategies today: increasing lean protein intake, decreasing simple carbohydrates, increasing vegetables, meal planning and cooking strategies, and keep a strict food journal. We discussed various medication options to help [name] with her weight loss efforts and we both agreed to switch to lomaira  and topiramate  at equivalent dose to the qsymia  she was taking previously.  Return in about 4 weeks (around 05/20/2024).   She was informed of the importance of frequent follow up visits to maximize her success with intensive lifestyle modifications for her multiple health conditions.  Attestation Statements:   Reviewed by clinician on day of visit: allergies, medications, problem list, medical history, surgical history, family history, social history, and previous encounter notes.     Adelita Cho, MD

## 2024-04-22 NOTE — Discharge Instructions (Signed)
 Please keep your splint clean and dry.  I have written you for oxycodone  for breakthrough pain.  This medication is sedating as it is a narcotic pain medicine.  Do not drive or do dangerous activities if taking this medicine.  Continue your antibiotic and indomethacin  as well.  Follow-up with your hand doctor that I have given you the information for.  If you develop a fever greater than 100.4 please return for evaluation.

## 2024-04-22 NOTE — ED Triage Notes (Signed)
 Pt c/o L pointer finger and knuckle swelling onset Saturday morning upon waking. Reports started in finger and now spreading to knuckle. Painful, swollen, slightly warm. Denies any injury/trauma. Has been seen at UC/PCP for same, suspected gout. Was prescribed indomethacin  w/ no relief.

## 2024-04-23 ENCOUNTER — Ambulatory Visit: Admitting: Sports Medicine

## 2024-04-25 ENCOUNTER — Other Ambulatory Visit (HOSPITAL_COMMUNITY): Payer: Self-pay

## 2024-04-28 NOTE — Assessment & Plan Note (Signed)
 Blood pressure slightly elevated but patient in pain due to hand.  Will make no changes in BP management but will follow up on BP at next appointment.  No chest pain, chest pressure or headache.

## 2024-04-28 NOTE — Assessment & Plan Note (Signed)
 The 10-year ASCVD risk score (Arnett DK, et al., 2019) is: 5.8%   Values used to calculate the score:     Age: 53 years     Clincally relevant sex: Female     Is Non-Hispanic African American: Yes     Diabetic: No     Tobacco smoker: No     Systolic Blood Pressure: 146 mmHg     Is BP treated: Yes     HDL Cholesterol: 51 mg/dL     Total Cholesterol: 167 mg/dL Patient is on lipitor 40mg  daily.  She is working on lifestyle changes to limit saturated fat intake.

## 2024-04-28 NOTE — Assessment & Plan Note (Signed)
 Patient started makeshift qsymia  at 193lbs.  She is at 196 today so no net loss.  Will continue doses to 12mg  of Lomaira  and 75mg  of Topiramate .  Re-evaluate dose and response at next appointment.

## 2024-04-28 NOTE — Assessment & Plan Note (Signed)
 Anthropometric Measurements Height: 5' 2 (1.575 m) Weight: 196 lb (88.9 kg) BMI (Calculated): 35.84 Weight at Last Visit: 190 lb Weight Lost Since Last Visit: 0 Weight Gained Since Last Visit: 6 Starting Weight: 192 lb Total Weight Loss (lbs): 0 lb (0 kg) Body Composition  Body Fat %: 44.3 % Fat Mass (lbs): 87 lbs Muscle Mass (lbs): 103.6 lbs Total Body Water (lbs): 72.2 lbs Visceral Fat Rating : 12 Other Clinical Data Today's Visit #: 14 Starting Date: 03/20/23 Comments: 1000-1100/80

## 2024-04-29 ENCOUNTER — Ambulatory Visit: Admitting: Family Medicine

## 2024-05-20 ENCOUNTER — Encounter (INDEPENDENT_AMBULATORY_CARE_PROVIDER_SITE_OTHER): Payer: Self-pay | Admitting: Family Medicine

## 2024-05-20 ENCOUNTER — Ambulatory Visit (INDEPENDENT_AMBULATORY_CARE_PROVIDER_SITE_OTHER): Admitting: Family Medicine

## 2024-05-20 VITALS — BP 114/69 | HR 65 | Temp 98.1°F | Ht 62.0 in | Wt 190.0 lb

## 2024-05-20 DIAGNOSIS — F5089 Other specified eating disorder: Secondary | ICD-10-CM | POA: Diagnosis not present

## 2024-05-20 DIAGNOSIS — Z6834 Body mass index (BMI) 34.0-34.9, adult: Secondary | ICD-10-CM

## 2024-05-20 DIAGNOSIS — E66812 Obesity, class 2: Secondary | ICD-10-CM | POA: Diagnosis not present

## 2024-05-20 DIAGNOSIS — E559 Vitamin D deficiency, unspecified: Secondary | ICD-10-CM | POA: Diagnosis not present

## 2024-05-20 MED ORDER — VITAMIN D (ERGOCALCIFEROL) 1.25 MG (50000 UNIT) PO CAPS: 50000.0000 [IU] | ORAL_CAPSULE | ORAL | 0 refills | Status: DC

## 2024-05-20 MED ORDER — TOPIRAMATE 100 MG PO TABS: 100.0000 mg | ORAL_TABLET | Freq: Every day | ORAL | 0 refills | Status: DC

## 2024-05-20 MED ORDER — LOMAIRA 8 MG PO TABS: 12.0000 mg | ORAL_TABLET | Freq: Every day | ORAL | 0 refills | Status: DC

## 2024-05-20 NOTE — Progress Notes (Unsigned)
 SUBJECTIVE:  Chief Complaint: Obesity  Interim History: Patient still has some residual swelling an soreness of her left index finger and is about to start occupational therapy.  Still not quite definitive on what caused this- ? Of dactylitis.  She mentions she was not as focused on nutrition when she was dealing with her hand issues due to pain and the effect it had on her life.  She wants to do less starches than she did previously- was eating quite a bit of potatoes previously.  She thinks she is likely going on a small trip over the next few weeks with her friend.  Foye is here to discuss her progress with her obesity treatment plan. She is on the keeping a food journal and adhering to recommended goals of 1000-1100 calories and 80 grams of protein and states she is following her eating plan approximately 40 % of the time. She states she is walking on walking pad.   OBJECTIVE: Visit Diagnoses: Problem List Items Addressed This Visit   None   Vitals Temp: 98.1 F (36.7 C) BP: 114/69 Pulse Rate: 65 SpO2: 100 %   Anthropometric Measurements Height: 5' 2 (1.575 m) Weight: 190 lb (86.2 kg) BMI (Calculated): 34.74 Weight at Last Visit: 196 lb Weight Lost Since Last Visit: 6 Weight Gained Since Last Visit: 0 Starting Weight: 192 lb Total Weight Loss (lbs): 2 lb (0.907 kg)   Body Composition  Body Fat %: 44 % Fat Mass (lbs): 83.8 lbs Muscle Mass (lbs): 101.4 lbs Total Body Water (lbs): 70.2 lbs Visceral Fat Rating : 11   Other Clinical Data Today's Visit #: 15 Starting Date: 03/20/23 Comments: 1000-1100/80     ASSESSMENT AND PLAN:  Diet: [place] is currently in the action stage of change. As such, her goal is to continue with weight loss efforts and has agreed to keeping a food journal and adhering to recommended goals of 1000-1100 calories and 80 or more grams of protein daily. Patient to start food log or journaling meal plan.  The initial goal will be to  habitually log or journal for at least 4 days a week.  The expectation it that patient may not initially meet calorie or protein goals as the nutritional understanding of food intake is begun.  We discussed the 10:1 ratio when reading a food label.  Patient agrees to keep a food log either electronically or on paper and bring to the next appointment to be able to dissect and discuss it with provider.    Exercise:  For substantial health benefits, adults should do at least 150 minutes (2 hours and 30 minutes) a week of moderate-intensity, or 75 minutes (1 hour and 15 minutes) a week of vigorous-intensity aerobic physical activity, or an equivalent combination of moderate- and vigorous-intensity aerobic activity. Aerobic activity should be performed in episodes of at least 10 minutes, and preferably, it should be spread throughout the week.  Behavior Modification:  We discussed the following Behavioral Modification Strategies today: increasing lean protein intake, decreasing simple carbohydrates, increasing vegetables, meal planning and cooking strategies, avoiding temptations, and planning for success. We discussed various medication options to help [place] with her weight loss efforts and we both agreed to ***.  No follow-ups on file.   She was informed of the importance of frequent follow up visits to maximize her success with intensive lifestyle modifications for her multiple health conditions.  Attestation Statements:   Reviewed by clinician on day of visit: allergies, medications, problem list, medical  history, surgical history, family history, social history, and previous encounter notes.   Time spent on visit including pre-visit chart review and post-visit care and charting was *** minutes  Adelita Cho, MD

## 2024-05-21 ENCOUNTER — Encounter (HOSPITAL_BASED_OUTPATIENT_CLINIC_OR_DEPARTMENT_OTHER): Payer: Self-pay | Admitting: Nurse Practitioner

## 2024-05-21 ENCOUNTER — Ambulatory Visit (HOSPITAL_BASED_OUTPATIENT_CLINIC_OR_DEPARTMENT_OTHER): Admitting: Nurse Practitioner

## 2024-05-21 VITALS — BP 104/76 | HR 60 | Ht 63.0 in | Wt 196.4 lb

## 2024-05-21 DIAGNOSIS — Z79899 Other long term (current) drug therapy: Secondary | ICD-10-CM

## 2024-05-21 DIAGNOSIS — I700 Atherosclerosis of aorta: Secondary | ICD-10-CM | POA: Diagnosis not present

## 2024-05-21 DIAGNOSIS — Z6834 Body mass index (BMI) 34.0-34.9, adult: Secondary | ICD-10-CM

## 2024-05-21 DIAGNOSIS — I10 Essential (primary) hypertension: Secondary | ICD-10-CM

## 2024-05-21 DIAGNOSIS — E7841 Elevated Lipoprotein(a): Secondary | ICD-10-CM

## 2024-05-21 DIAGNOSIS — E6609 Other obesity due to excess calories: Secondary | ICD-10-CM

## 2024-05-21 DIAGNOSIS — E66811 Obesity, class 1: Secondary | ICD-10-CM | POA: Diagnosis not present

## 2024-05-21 DIAGNOSIS — Z7189 Other specified counseling: Secondary | ICD-10-CM

## 2024-05-21 DIAGNOSIS — E785 Hyperlipidemia, unspecified: Secondary | ICD-10-CM | POA: Diagnosis not present

## 2024-05-21 NOTE — Patient Instructions (Signed)
 Medication Instructions:   Your physician recommends that you continue on your current medications as directed. Please refer to the Current Medication list given to you today.   *If you need a refill on your cardiac medications before your next appointment, please call your pharmacy*  Lab Work:  Your physician recommends that you return for a FASTING NMR/ALT/LPa, fasting after midnight. Patient given paperwork today.   If you have labs (blood work) drawn today and your tests are completely normal, you will receive your results only by: MyChart Message (if you have MyChart) OR A paper copy in the mail If you have any lab test that is abnormal or we need to change your treatment, we will call you to review the results.  Testing/Procedures:  None ordered.  Follow-Up: At [facility], you and your health needs are our priority.  As part of our continuing mission to provide you with exceptional heart care, our providers are all part of one team.  This team includes your primary Cardiologist (physician) and Advanced Practice Providers or APPs (Physician Assistants and Nurse Practitioners) who all work together to provide you with the care you need, when you need it.  Your next appointment:   1 year(s)  Provider:   K. [place] Hilty, MD or Rosaline Bane, NP    We recommend signing up for the patient portal called MyChart.  Sign up information is provided on this After Visit Summary.  MyChart is used to connect with patients for Virtual Visits (Telemedicine).  Patients are able to view lab/test results, encounter notes, upcoming appointments, etc.  Non-urgent messages can be sent to your provider as well.   To learn more about what you can do with MyChart, go to [email or web address].   Other Instructions  Your physician wants you to follow-up in: 1 year.  You will receive a reminder letter in the mail two months in advance. If you don't receive a letter, please call our office  to schedule the follow-up appointment.

## 2024-05-21 NOTE — Progress Notes (Signed)
 Cardiology Office Note   Date:  05/21/2024  ID:  Crystal Shea, Crystal Shea [birth year], MRN [id number] PCP: Thedora Garnette HERO, MD  [facility] Providers Cardiologist:  None     PMH Dyslipidemia Aortic atherosclerosis Family history of heart disease Possible familial hyperlipidemia, untreated LDL greater than 190  Referred to advanced lipid disorder clinic and seen by Dr. Mona 07/13/2023.  Lipid panel at that time revealed total cholesterol 195, triglycerides 64, HDL 62, and LDL 121.  She was on atorvastatin  40 and ezetimibe  10 mg daily.  Cholesterol had been much higher in the past.  She had seen Dr. Ladona in 2022 and he had added ezetimibe .  CT of her abdomen in May 2023 showed aortic atherosclerosis.  Strong family history of heart disease with 2 sisters in their 74s with heart issues, father died in his 76s, paternal GM died in her 30s from heart disease.   CT calcium  score 07/23/2023 revealed CAC of 0.  She was started on Repatha  140 mg twice daily due to familial hyperlipidemia, goal LDL 70 or lower not reached on statin plus ezetimibe .  History of Present Illness Discussed the use of AI scribe software for clinical note transcription with the patient, who gave verbal consent to proceed.  History of Present Illness Crystal Shea is a very pleasant 53 year old female who is here for follow-up of hyperlipidemia. She is concerned about the number of medications that she is taking. She started Repatha  in September or October and is also on ezetimibe  and atorvastatin . Her last cholesterol check in January showed an LDL of 104. She has a significant family history of heart disease, with her father, grandmother, and sisters having heart issues. Her father died early, and her grandmother died in her thirties from heart problems.  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  She has bought a walking pad to increase mobility during the day.   Is working with a weight loss practitioner and has achieved 6 lb weight loss. She is trying to eat better and has been journaling her progress. She is taking phentermine  for weight loss, which was recently adjusted after experiencing weakness and dizziness. She denies chest pain or shortness of breath, orthopnea, PND, edema, palpitations, presyncope, syncope.  Had one episode of significant dizziness resulting in vomiting recently but it resolved without intervention and has not reoccurred.    ROS: See HPI  Studies Reviewed       Lipoprotein (a)  Date/Time Value Ref Range Status  07/13/2023 02:50 PM 139.7 (H) <75.0 nmol/L Final    Comment:    Note:  Values greater than or equal to 75.0 nmol/L may        indicate an independent risk factor for CHD,        but must be evaluated with caution when applied        to non-Caucasian populations due to the        influence of genetic factors on Lp(a) across        ethnicities.     Risk Assessment/Calculations           Physical Exam VS:  BP 104/76 (BP Location: Left Arm, Patient Position: Sitting, Cuff Size: Normal)   Pulse 60   Ht 5' 3 (1.6 m)   Wt 196 lb 6.4 oz (89.1 kg)   LMP 02/03/2015 (Approximate)   SpO2 99%   BMI 34.79 kg/m  Wt Readings from Last 3 Encounters:  05/21/24 196 lb 6.4 oz (89.1 kg)  05/20/24 190 lb (86.2 kg)  04/22/24 196 lb (88.9 kg)    GEN: Well nourished, well developed in no acute distress NECK: No JVD; No carotid bruits CARDIAC: RRR, no murmurs, rubs, gallops RESPIRATORY:  Clear to auscultation without rales, wheezing or rhonchi  ABDOMEN: Soft, non-tender, non-distended EXTREMITIES:  No edema; No deformity   Assessment & Plan Hyperlipidemia LDL goal < 70 Elevated lipoprotein(a)   Aortic atherosclerosis Cardiac risk assessment LP(a) 139.7 on 07/13/23. Lipid panel completed 11/12/23 with total cholesterol 167, HDL 51, LDL 104, and triglycerides 58.  We do not have repeat lipid testing since she  started Repatha .  She is not having any concerning side effects from current lipid lowering medications. She denies chest pain, dyspnea, or other symptoms concerning for angina.  No indication for further ischemic evaluation at this time -We will get ALT, LP(a) and NMR for surveillance of lipid therapy -Continue Repatha , ezetimibe , and atorvastatin  for now -Continue regular exercise, and lifestyle modifications to aim for at least 150 minutes of moderate intensity exercise each week  -Continue healthy, mostly whole food diet limiting saturated fat, processed foods, sugar, and other simple carbohydrates  Hypertension   Blood pressure is well-controlled. She is tolerating medications without concerning side effects. History of LVH on echo in 2020. She is asymptomatic. She is interested in reducing medications, however it is important to maintain good BP control. No change in anti-hypertensive therapy today.  - Advised regular home BP monitoring for surveillance. -Continue lisinopril  and hydrochlorothiazide  - Hypertension management per PCP  Medication Management She is concerned about the number of medications that she is taking. We reviewed her cardiac medications in detail.  -Consider reducing lipid lowering therapy if LDL well below goal of < 70  Obesity   Ongoing management includes lifestyle modifications, resulting in a recent 6-pound weight loss. Phentermine  is used for weight loss. I warned her regarding potential adverse cardiac effects. Dosage recently adjusted due to dizziness and weakness.  -Continue lifestyle modifications, including diet and exercise -Monitor for cardiac symptoms on phentermine  and discuss with health care provider -Consider incorporating weightlifting and/or resistance training for potential additional weight loss benefit again for strengthening        Dispo: 1 year with Dr. Mona or me  Signed, Rosaline Bane, NP-C

## 2024-05-21 NOTE — Assessment & Plan Note (Signed)
 Patient is on prescription strength vitamin D  supplementation.  She needs a refill of her vitamin D  today.  No nausea, vomiting, or muscle weakness mentioned.  Will need repeat labs within the next 2 months to reevaluate response to treatment.

## 2024-05-21 NOTE — Assessment & Plan Note (Signed)
 Patient started makeshift qsymia  at 193lbs.  She is at 196 today so no net loss.  Will continue doses to 12mg  of Lomaira  and 75mg  of Topiramate .  If patient does not experience any significant weight loss at time of next appointment we will decrease dose and taper off of Lomaira  and topiramate .  1 month refill of both medications sent to pharmacy.  Reevaluate treatment at next appointment.

## 2024-06-17 ENCOUNTER — Ambulatory Visit (INDEPENDENT_AMBULATORY_CARE_PROVIDER_SITE_OTHER): Admitting: Family Medicine

## 2024-06-24 ENCOUNTER — Encounter (INDEPENDENT_AMBULATORY_CARE_PROVIDER_SITE_OTHER): Payer: Self-pay | Admitting: Family Medicine

## 2024-06-24 ENCOUNTER — Ambulatory Visit (INDEPENDENT_AMBULATORY_CARE_PROVIDER_SITE_OTHER): Admitting: Family Medicine

## 2024-06-24 VITALS — BP 128/72 | HR 64 | Temp 98.4°F | Ht 62.0 in | Wt 190.0 lb

## 2024-06-24 DIAGNOSIS — E66812 Obesity, class 2: Secondary | ICD-10-CM | POA: Diagnosis not present

## 2024-06-24 DIAGNOSIS — E559 Vitamin D deficiency, unspecified: Secondary | ICD-10-CM

## 2024-06-24 DIAGNOSIS — Z6834 Body mass index (BMI) 34.0-34.9, adult: Secondary | ICD-10-CM

## 2024-06-24 DIAGNOSIS — F5089 Other specified eating disorder: Secondary | ICD-10-CM | POA: Diagnosis not present

## 2024-06-24 DIAGNOSIS — Z6835 Body mass index (BMI) 35.0-35.9, adult: Secondary | ICD-10-CM

## 2024-06-24 MED ORDER — VITAMIN D (ERGOCALCIFEROL) 1.25 MG (50000 UNIT) PO CAPS: 50000.0000 [IU] | ORAL_CAPSULE | ORAL | 0 refills | Status: DC

## 2024-06-24 NOTE — Assessment & Plan Note (Signed)
 Patient asking about increase in dose of phentermine . However, on evaluation of weight and dosage history she has not experienced significant loss (5% or more on phentermine  topiramate  combination) over 3 months. Will discontinue phentermine  and topiramate  medications and follow up on weight at next appointment to ensure patient is not experiencing significant gain.  May want to try Contrave.

## 2024-06-24 NOTE — Progress Notes (Unsigned)
 SUBJECTIVE:  Chief Complaint: Obesity  Interim History: Over the last month patient reports she has been doing better and staying consistent with her journaling and eating more on plan.  She missed our previous appointment as her daughter was ill with gastrointestinal illness.  Patient has been going to physical therapy for her finger and hand.  She is wondering what is really going on with her hand.  She is going to [facility] next week.  She is hoping to go to some football games over the next few months.   Crystal Shea is here to discuss her progress with her obesity treatment plan. She is on the keeping a food journal and adhering to recommended goals of 1000-1100 calories and 80 grams of protein and states she is following her eating plan approximately 70 % of the time. She states she is exercising 15 minutes 3-4 times per week.   OBJECTIVE: Visit Diagnoses: Problem List Items Addressed This Visit   None   Vitals Temp: 98.4 F (36.9 C) BP: 128/72 Pulse Rate: 64 SpO2: 99 %   Anthropometric Measurements Height: 5' 2 (1.575 m) Weight: 190 lb (86.2 kg) BMI (Calculated): 34.74 Weight at Last Visit: 190 lb Weight Lost Since Last Visit: 0 Weight Gained Since Last Visit: 0 Starting Weight: 192 lb Total Weight Loss (lbs): 2 lb (0.907 kg)   Body Composition  Body Fat %: 44.6 % Fat Mass (lbs): 85 lbs Muscle Mass (lbs): 100.4 lbs Total Body Water (lbs): 71.8 lbs Visceral Fat Rating : 12   Other Clinical Data Today's Visit #: 16 Starting Date: 03/20/23 Comments: 1000-1100/80     ASSESSMENT AND PLAN: Assessment & Plan Other disorder of eating Patient asking about increase in dose of phentermine . However, on evaluation of weight and dosage history she has not experienced significant loss (5% or more on phentermine  topiramate  combination) over 3 months. Will discontinue phentermine  and topiramate  medications and follow up on weight at next appointment to ensure  patient is not experiencing significant gain.  May want to try Contrave. Vitamin D  deficiency On prescription strength vitamin D .  Last vitamin D  level from January.  No nausea, vomiting, or muscle weakness reported.  Needs refill of vitamin D  today. Class 2 severe obesity with serious comorbidity and body mass index (BMI) of 35.0 to 35.9 in adult, unspecified obesity type (HCC)  BMI 34.0-34.9,adult    Diet: [name] is currently in the action stage of change. As such, her goal is to continue with weight loss efforts and has agreed to keeping a food journal and adhering to recommended goals of 1000-1100 calories and 80 protein.   Exercise:  For substantial health benefits, adults should do at least 150 minutes (2 hours and 30 minutes) a week of moderate-intensity, or 75 minutes (1 hour and 15 minutes) a week of vigorous-intensity aerobic physical activity, or an equivalent combination of moderate- and vigorous-intensity aerobic activity. Aerobic activity should be performed in episodes of at least 10 minutes, and preferably, it should be spread throughout the week.  Behavior Modification:  We discussed the following Behavioral Modification Strategies today: increasing lean protein intake, decreasing simple carbohydrates, increasing vegetables, meal planning and cooking strategies, keeping healthy foods in the home, planning for success, and keep a strict food journal. We discussed various medication options to help [place] with her weight loss efforts and we both agreed to stop phentermine  and topiramate  and re-evaluate at next appointment.   Follow-up in 6 weeks  She was informed of the  importance of frequent follow up visits to maximize her success with intensive lifestyle modifications for her multiple health conditions.  Attestation Statements:   Reviewed by clinician on day of visit: allergies, medications, problem list, medical history, surgical history, family history, social history, and  previous encounter notes.   Adelita Cho, MD

## 2024-06-26 NOTE — Assessment & Plan Note (Signed)
 On prescription strength vitamin D .  Last vitamin D  level from January.  No nausea, vomiting, or muscle weakness reported.  Needs refill of vitamin D  today.

## 2024-06-30 ENCOUNTER — Other Ambulatory Visit (HOSPITAL_COMMUNITY): Payer: Self-pay

## 2024-07-02 ENCOUNTER — Other Ambulatory Visit (HOSPITAL_COMMUNITY): Payer: Self-pay

## 2024-07-02 ENCOUNTER — Telehealth: Payer: Self-pay | Admitting: Pharmacy Technician

## 2024-07-02 NOTE — Telephone Encounter (Signed)
 Hi, insurance is asking for updated labs since starting repatha . Last labs were from 11/12/23 before repatha  was started. Labs were ordered at her last visit but doesn't appear to have been done. Can she please get labs? thank you

## 2024-07-02 NOTE — Telephone Encounter (Signed)
 MyChart message sent to patient asking her to come get her labs completed.

## 2024-07-04 NOTE — Telephone Encounter (Signed)
 Called - reviewed need for labs. She is agreeable to collect next week and has her lab slips.   Rachard Isidro S Will Schier, NP

## 2024-07-09 LAB — NMR, LIPOPROFILE
Cholesterol, Total: 171 mg/dL (ref 100–199)
HDL Particle Number: 29.4 umol/L — ABNORMAL LOW (ref >=30.5)
HDL-C: 68 mg/dL (ref >39)
LDL Particle Number: 898 nmol/L (ref <1000)
LDL Size: 21.3 nm (ref >20.5)
LDL-C (NIH Calc): 92 mg/dL (ref 0–99)
LP-IR Score: <25 (ref <=45)
Small LDL Particle Number: 211 nmol/L (ref <=527)
Triglycerides: 52 mg/dL (ref 0–149)

## 2024-07-09 LAB — ALT: ALT: 16 IU/L (ref 0–32)

## 2024-07-09 LAB — LIPOPROTEIN A (LPA): Lipoprotein (a): 483 nmol/L — ABNORMAL HIGH (ref <75.0)

## 2024-07-10 ENCOUNTER — Ambulatory Visit: Payer: Self-pay | Admitting: Nurse Practitioner

## 2024-07-10 ENCOUNTER — Other Ambulatory Visit (HOSPITAL_COMMUNITY): Payer: Self-pay

## 2024-07-10 MED ORDER — REPATHA SURECLICK 140 MG/ML ~~LOC~~ SOAJ
140.0000 mg | SUBCUTANEOUS | 11 refills | Status: AC
  Filled 2024-07-10 – 2024-08-20 (×2): qty 2, 28d supply, fill #0
  Filled 2024-10-13: qty 2, 28d supply, fill #1
  Filled 2024-11-13 – 2024-11-19 (×2): qty 2, 28d supply, fill #2

## 2024-07-10 NOTE — Telephone Encounter (Signed)
 Pharmacy Patient Advocate Encounter  Received notification from [facility] that Prior Authorization for repatha  has been APPROVED from 07/10/24 to 07/10/25   PA #/Case ID/Reference #: [id number]

## 2024-07-10 NOTE — Telephone Encounter (Signed)
 It looks like the patient may not be seeing mychart messages so I called her.  Reviewed that prior auth has been renewed for Repatha .  She needs a refill.  I sent a new prescription to pharmacy.  She will pick up and get back on this.

## 2024-07-10 NOTE — Telephone Encounter (Signed)
 Pharmacy Patient Advocate Encounter   Received notification from [facility] that Prior Authorization for repatha  has been APPROVED from 07/10/24 to 07/10/25    PA #/Case ID/Reference #: [id number]   Sent pt a message

## 2024-07-10 NOTE — Telephone Encounter (Signed)
 Pharmacy Patient Advocate Encounter   Received notification from Pt Calls Messages that prior authorization for repatha  is required/requested.   Insurance verification completed.   The patient is insured through [facility] .   Per test claim: PA required; PA submitted to above mentioned insurance via Latent Key/confirmation #/EOC [id number] Status is pending

## 2024-07-10 NOTE — Addendum Note (Signed)
 Addended by: Mattye Verdone on: 07/10/2024 03:11 PM   Modules accepted: Orders

## 2024-07-16 ENCOUNTER — Other Ambulatory Visit (INDEPENDENT_AMBULATORY_CARE_PROVIDER_SITE_OTHER): Payer: Self-pay | Admitting: Family Medicine

## 2024-07-16 DIAGNOSIS — E7800 Pure hypercholesterolemia, unspecified: Secondary | ICD-10-CM

## 2024-07-21 ENCOUNTER — Other Ambulatory Visit (INDEPENDENT_AMBULATORY_CARE_PROVIDER_SITE_OTHER): Payer: Self-pay | Admitting: Family Medicine

## 2024-07-21 DIAGNOSIS — I10 Essential (primary) hypertension: Secondary | ICD-10-CM

## 2024-07-22 ENCOUNTER — Other Ambulatory Visit (HOSPITAL_COMMUNITY): Payer: Self-pay

## 2024-07-25 ENCOUNTER — Other Ambulatory Visit (INDEPENDENT_AMBULATORY_CARE_PROVIDER_SITE_OTHER): Payer: Self-pay | Admitting: Family Medicine

## 2024-07-25 DIAGNOSIS — I10 Essential (primary) hypertension: Secondary | ICD-10-CM

## 2024-08-04 ENCOUNTER — Other Ambulatory Visit (INDEPENDENT_AMBULATORY_CARE_PROVIDER_SITE_OTHER): Payer: Self-pay | Admitting: Family Medicine

## 2024-08-04 DIAGNOSIS — E7800 Pure hypercholesterolemia, unspecified: Secondary | ICD-10-CM

## 2024-08-05 ENCOUNTER — Encounter (INDEPENDENT_AMBULATORY_CARE_PROVIDER_SITE_OTHER): Payer: Self-pay | Admitting: Family Medicine

## 2024-08-05 ENCOUNTER — Ambulatory Visit (INDEPENDENT_AMBULATORY_CARE_PROVIDER_SITE_OTHER): Admitting: Family Medicine

## 2024-08-05 VITALS — BP 112/71 | HR 61 | Temp 98.0°F | Ht 62.0 in | Wt 198.0 lb

## 2024-08-05 DIAGNOSIS — E66812 Obesity, class 2: Secondary | ICD-10-CM

## 2024-08-05 DIAGNOSIS — Z6835 Body mass index (BMI) 35.0-35.9, adult: Secondary | ICD-10-CM

## 2024-08-05 DIAGNOSIS — E782 Mixed hyperlipidemia: Secondary | ICD-10-CM | POA: Diagnosis not present

## 2024-08-05 DIAGNOSIS — E7800 Pure hypercholesterolemia, unspecified: Secondary | ICD-10-CM | POA: Diagnosis not present

## 2024-08-05 DIAGNOSIS — I10 Essential (primary) hypertension: Secondary | ICD-10-CM | POA: Diagnosis not present

## 2024-08-05 DIAGNOSIS — Z6836 Body mass index (BMI) 36.0-36.9, adult: Secondary | ICD-10-CM

## 2024-08-05 MED ORDER — LISINOPRIL 5 MG PO TABS: 5.0000 mg | ORAL_TABLET | Freq: Every day | ORAL | 0 refills | Status: DC

## 2024-08-05 MED ORDER — HYDROCHLOROTHIAZIDE 12.5 MG PO TABS: 12.5000 mg | ORAL_TABLET | Freq: Every day | ORAL | 0 refills | Status: DC

## 2024-08-05 MED ORDER — EZETIMIBE 10 MG PO TABS: 10.0000 mg | ORAL_TABLET | Freq: Every day | ORAL | 0 refills | Status: DC

## 2024-08-05 MED ORDER — ATORVASTATIN CALCIUM 40 MG PO TABS: 40.0000 mg | ORAL_TABLET | Freq: Every day | ORAL | 3 refills | Status: AC

## 2024-08-05 NOTE — Telephone Encounter (Signed)
**Note De-identified  [place] Obfuscation** Please advise 

## 2024-08-05 NOTE — Progress Notes (Signed)
 SUBJECTIVE:  Chief Complaint: Obesity  Interim History: Patient felt her appetite really start to come back a week to two weeks after stopping all her medications.  She also is feeling very fatigued.  She has been journaling and has been around 1100 calories and is occasionally going over her calories.  Protein intake is unknown- she is trying to focus on 10:1 ratio.  She has done inconsistent protein supplements like a bar or shake.  She is wondering about GLP1 usage and availability.  She is planning a trunk or treat for Halloween.  Her family is going to her house for Thanksgiving.   Garielle is here to discuss her progress with her obesity treatment plan. She is on the keeping a food journal and adhering to recommended goals of 1000-1100 calories and 80 grams of protein and states she is following her eating plan approximately 75 % of the time. She states she is exercising 15-20 minutes 3 times per week.   OBJECTIVE: Visit Diagnoses: Problem List Items Addressed This Visit       Cardiovascular and Mediastinum   Essential hypertension   Relevant Medications   atorvastatin  (LIPITOR) 40 MG tablet   ezetimibe  (ZETIA ) 10 MG tablet   hydrochlorothiazide  (HYDRODIURIL ) 12.5 MG tablet   lisinopril  (ZESTRIL ) 5 MG tablet     Other   Hyperlipidemia   Relevant Medications   atorvastatin  (LIPITOR) 40 MG tablet   ezetimibe  (ZETIA ) 10 MG tablet   hydrochlorothiazide  (HYDRODIURIL ) 12.5 MG tablet   lisinopril  (ZESTRIL ) 5 MG tablet    Vitals Temp: 98 F (36.7 C) BP: 112/71 Pulse Rate: 61 SpO2: 100 %   Anthropometric Measurements Height: 5' 2 (1.575 m) Weight: 198 lb (89.8 kg) BMI (Calculated): 36.21 Weight at Last Visit: 190 lb Weight Lost Since Last Visit: 0 Weight Gained Since Last Visit: 8 Starting Weight: 192 lb Total Weight Loss (lbs): 0 lb (0 kg)   Body Composition  Body Fat %: 45.3 % Fat Mass (lbs): 90 lbs Muscle Mass (lbs): 103 lbs Total Body Water (lbs): 77  lbs Visceral Fat Rating : 12   Other Clinical Data Today's Visit #: 17 Starting Date: 03/20/23 Comments: 1000-1100/80     ASSESSMENT AND PLAN: Assessment & Plan Mixed hyperlipidemia Patient needs refill of Setia as well as statin today.  No side effects reported of current pharmacotherapy treatment plan.  Will follow-up at next appointment as to whether or not patient has developed any side effects. Pure hypercholesterolemia  Essential hypertension Blood pressure well-managed today.  Needs refills of current treatment of lisinopril  as well as hydrochlorothiazide .  No change in dosage.  Refill sent to pharmacy.  Follow-up on blood pressures at next appointment. Class 2 severe obesity with serious comorbidity and body mass index (BMI) of 35.0 to 35.9 in adult, unspecified obesity type  BMI 36.0-36.9,adult    Diet: [place] is currently in the action stage of change. As such, her goal is to continue with weight loss efforts and has agreed to keeping a food journal and adhering to recommended goals of 1000-1100 calories and 80 or more grams of protein daily.   Exercise:  For substantial health benefits, adults should do at least 150 minutes (2 hours and 30 minutes) a week of moderate-intensity, or 75 minutes (1 hour and 15 minutes) a week of vigorous-intensity aerobic physical activity, or an equivalent combination of moderate- and vigorous-intensity aerobic activity. Aerobic activity should be performed in episodes of at least 10 minutes, and preferably, it should be spread  throughout the week.  Behavior Modification:  We discussed the following Behavioral Modification Strategies today: increasing lean protein intake, decreasing simple carbohydrates, meal planning and cooking strategies, planning for success, and keep a strict food journal. We discussed various medication options to help [place] with her weight loss efforts and we both agreed to explore options for medication and compare  costs.  She will examine cost plus drugs.  Return in about 5 weeks (around 09/09/2024).   She was informed of the importance of frequent follow up visits to maximize her success with intensive lifestyle modifications for her multiple health conditions.  Attestation Statements:   Reviewed by clinician on day of visit: allergies, medications, problem list, medical history, surgical history, family history, social history, and previous encounter notes.     Adelita Cho, MD

## 2024-08-13 NOTE — Assessment & Plan Note (Signed)
 Blood pressure well-managed today.  Needs refills of current treatment of lisinopril  as well as hydrochlorothiazide .  No change in dosage.  Refill sent to pharmacy.  Follow-up on blood pressures at next appointment.

## 2024-08-13 NOTE — Assessment & Plan Note (Signed)
 Patient needs refill of Setia as well as statin today.  No side effects reported of current pharmacotherapy treatment plan.  Will follow-up at next appointment as to whether or not patient has developed any side effects.

## 2024-08-20 ENCOUNTER — Other Ambulatory Visit (HOSPITAL_COMMUNITY): Payer: Self-pay

## 2024-09-09 ENCOUNTER — Encounter (INDEPENDENT_AMBULATORY_CARE_PROVIDER_SITE_OTHER): Payer: Self-pay | Admitting: Family Medicine

## 2024-09-09 ENCOUNTER — Ambulatory Visit (INDEPENDENT_AMBULATORY_CARE_PROVIDER_SITE_OTHER): Payer: Self-pay | Admitting: Family Medicine

## 2024-09-09 VITALS — BP 118/77 | HR 64 | Temp 97.7°F | Ht 62.0 in | Wt 200.0 lb

## 2024-09-09 DIAGNOSIS — E66812 Obesity, class 2: Secondary | ICD-10-CM | POA: Diagnosis not present

## 2024-09-09 DIAGNOSIS — Z6835 Body mass index (BMI) 35.0-35.9, adult: Secondary | ICD-10-CM

## 2024-09-09 DIAGNOSIS — Z6836 Body mass index (BMI) 36.0-36.9, adult: Secondary | ICD-10-CM

## 2024-09-09 DIAGNOSIS — E782 Mixed hyperlipidemia: Secondary | ICD-10-CM

## 2024-09-09 MED ORDER — TIRZEPATIDE-WEIGHT MANAGEMENT 5 MG/0.5ML ~~LOC~~ SOLN: 5.0000 mg | SUBCUTANEOUS | 0 refills | Status: DC

## 2024-09-09 NOTE — Progress Notes (Signed)
 SUBJECTIVE:  Chief Complaint: Obesity  Interim History: Patient's sister and niece are coming to her house for Thanksgiving.  She is the one that is cooking.  She is taking the last two weeks of December off.  She is interested in other options for obesity pharmacotherapy.  She is planning on staying consistent with her calorie budget and protein goal.   Crystal Shea is here to discuss her progress with her obesity treatment plan. She is on the keeping a food journal and adhering to recommended goals of 1000-1100 calories and 80 grams of protein and states she is following her eating plan approximately 50 % of the time. She states she is not exercising as much.   OBJECTIVE: Visit Diagnoses: Problem List Items Addressed This Visit   None   Vitals Temp: 97.7 F (36.5 C) BP: 118/77 Pulse Rate: 64 SpO2: 99 %   Anthropometric Measurements Height: 5' 2 (1.575 m) Weight: 200 lb (90.7 kg) BMI (Calculated): 36.57 Weight at Last Visit: 198 lb Weight Lost Since Last Visit: 0 Weight Gained Since Last Visit: 2 Starting Weight: 192 lb Total Weight Loss (lbs): 0 lb (0 kg)   Body Composition  Body Fat %: 45.4 % Fat Mass (lbs): 90.8 lbs Muscle Mass (lbs): 103.8 lbs Total Body Water (lbs): 74.6 lbs Visceral Fat Rating : 12   Other Clinical Data Today's Visit #: 18 Starting Date: 03/20/23 Comments: 1000-1100/80     ASSESSMENT AND PLAN: Assessment & Plan BMI 36.0-36.9,adult  Mixed hyperlipidemia Patient is on atorvastatin , Zetia , and Repatha  for cholesterol management.  Most recent LDL of 92.  Defer treatment to managing provider. Class 2 severe obesity with serious comorbidity and body mass index (BMI) of 35.0 to 35.9 in adult, unspecified obesity type Previously patient had been combination phentermine  and topiramate  and did not achieve 5% loss with consistent usage.  Interested in alternative medication at this time.  We discussed a variety of other medications and patient  opted to pursue out-of-pocket cost for Zepbound  vials.  Discussed how to use in terms of syringe, drawing from vial, and how to discard of sharps.  Patient was told she can come to the clinic for guidance if she has questions prior to her first injection.  Will follow-up at next appointment on current dosage.  Prescription sent to [facility] direct pharmacy.   Diet: Korina is currently in the action stage of change. As such, her goal is to continue with weight loss efforts and has agreed to keeping a food journal and adhering to recommended goals of 1000-1100 calories and 75 or more grams of protein daily.   Exercise:  For substantial health benefits, adults should do at least 150 minutes (2 hours and 30 minutes) a week of moderate-intensity, or 75 minutes (1 hour and 15 minutes) a week of vigorous-intensity aerobic physical activity, or an equivalent combination of moderate- and vigorous-intensity aerobic activity. Aerobic activity should be performed in episodes of at least 10 minutes, and preferably, it should be spread throughout the week.  Behavior Modification:  We discussed the following Behavioral Modification Strategies today: increasing lean protein intake, decreasing simple carbohydrates, increasing vegetables, meal planning and cooking strategies, and planning for success. We discussed various medication options to help [facility] with her weight loss efforts and we both agreed to start Zepbound  at 2.5mg  weekly.  No follow-ups on file.   She was informed of the importance of frequent follow up visits to maximize her success with intensive lifestyle modifications for her multiple health conditions.  Attestation Statements:   Reviewed by clinician on day of visit: allergies, medications, problem list, medical history, surgical history, family history, social history, and previous encounter notes.     Adelita Cho, MD

## 2024-09-14 NOTE — Assessment & Plan Note (Signed)
 Patient is on atorvastatin , Zetia , and Repatha  for cholesterol management.  Most recent LDL of 92.  Defer treatment to managing provider.

## 2024-09-30 ENCOUNTER — Other Ambulatory Visit (INDEPENDENT_AMBULATORY_CARE_PROVIDER_SITE_OTHER): Payer: Self-pay | Admitting: Family Medicine

## 2024-09-30 DIAGNOSIS — Z6835 Body mass index (BMI) 35.0-35.9, adult: Secondary | ICD-10-CM

## 2024-10-03 ENCOUNTER — Other Ambulatory Visit: Payer: Self-pay

## 2024-10-03 ENCOUNTER — Emergency Department (HOSPITAL_BASED_OUTPATIENT_CLINIC_OR_DEPARTMENT_OTHER)

## 2024-10-03 ENCOUNTER — Ambulatory Visit: Payer: Self-pay

## 2024-10-03 ENCOUNTER — Emergency Department (HOSPITAL_BASED_OUTPATIENT_CLINIC_OR_DEPARTMENT_OTHER)
Admission: EM | Admit: 2024-10-03 | Discharge: 2024-10-03 | Disposition: A | Discharge status: Home / Self Care | Attending: Emergency Medicine | Admitting: Emergency Medicine

## 2024-10-03 DIAGNOSIS — R002 Palpitations: Secondary | ICD-10-CM | POA: Diagnosis not present

## 2024-10-03 DIAGNOSIS — R109 Unspecified abdominal pain: Secondary | ICD-10-CM | POA: Diagnosis not present

## 2024-10-03 DIAGNOSIS — K921 Melena: Secondary | ICD-10-CM | POA: Diagnosis not present

## 2024-10-03 DIAGNOSIS — H81399 Other peripheral vertigo, unspecified ear: Secondary | ICD-10-CM | POA: Insufficient documentation

## 2024-10-03 DIAGNOSIS — R112 Nausea with vomiting, unspecified: Secondary | ICD-10-CM | POA: Diagnosis present

## 2024-10-03 DIAGNOSIS — R197 Diarrhea, unspecified: Secondary | ICD-10-CM | POA: Insufficient documentation

## 2024-10-03 DIAGNOSIS — Z794 Long term (current) use of insulin: Secondary | ICD-10-CM | POA: Insufficient documentation

## 2024-10-03 LAB — URINALYSIS, ROUTINE W REFLEX MICROSCOPIC
Bilirubin Urine: NEGATIVE
Glucose, UA: NEGATIVE mg/dL
Ketones, ur: NEGATIVE mg/dL
Nitrite: NEGATIVE
Protein, ur: NEGATIVE mg/dL
Specific Gravity, Urine: 1.026 (ref 1.005–1.030)
pH: 5.5 (ref 5.0–8.0)

## 2024-10-03 LAB — CBC
HCT: 41 % (ref 36.0–46.0)
Hemoglobin: 13.7 g/dL (ref 12.0–15.0)
MCH: 28.8 pg (ref 26.0–34.0)
MCHC: 33.4 g/dL (ref 30.0–36.0)
MCV: 86.3 fL (ref 80.0–100.0)
Platelets: 246 K/uL (ref 150–400)
RBC: 4.75 MIL/uL (ref 3.87–5.11)
RDW: 13 % (ref 11.5–15.5)
WBC: 4.6 K/uL (ref 4.0–10.5)
nRBC: 0 % (ref 0.0–0.2)

## 2024-10-03 LAB — COMPREHENSIVE METABOLIC PANEL WITH GFR
ALT: 12 U/L (ref 0–44)
AST: 21 U/L (ref 15–41)
Albumin: 4 g/dL (ref 3.5–5.0)
Alkaline Phosphatase: 110 U/L (ref 38–126)
Anion gap: 10 (ref 5–15)
BUN: 13 mg/dL (ref 6–20)
CO2: 27 mmol/L (ref 22–32)
Calcium: 9.6 mg/dL (ref 8.9–10.3)
Chloride: 103 mmol/L (ref 98–111)
Creatinine, Ser: 0.83 mg/dL (ref 0.44–1.00)
GFR, Estimated: >60 mL/min
Glucose, Bld: 89 mg/dL (ref 70–99)
Potassium: 3.2 mmol/L — ABNORMAL LOW (ref 3.5–5.1)
Sodium: 140 mmol/L (ref 135–145)
Total Bilirubin: 0.6 mg/dL (ref 0.0–1.2)
Total Protein: 8 g/dL (ref 6.5–8.1)

## 2024-10-03 LAB — LIPASE, BLOOD: Lipase: 32 U/L (ref 11–51)

## 2024-10-03 MED ORDER — IOHEXOL 300 MG/ML  SOLN
100.0000 mL | Freq: Once | INTRAMUSCULAR | Status: AC | PRN
  Administered 2024-10-03: 100 mL via INTRAVENOUS

## 2024-10-03 MED ORDER — PROCHLORPERAZINE MALEATE 10 MG PO TABS: 10.0000 mg | ORAL_TABLET | Freq: Two times a day (BID) | ORAL | 0 refills | Status: AC | PRN

## 2024-10-03 MED ORDER — SODIUM CHLORIDE 0.9 % IV BOLUS
1000.0000 mL | Freq: Once | INTRAVENOUS | Status: AC
  Administered 2024-10-03: 1000 mL via INTRAVENOUS

## 2024-10-03 MED ORDER — POTASSIUM CHLORIDE CRYS ER 20 MEQ PO TBCR
40.0000 meq | EXTENDED_RELEASE_TABLET | Freq: Once | ORAL | Status: AC
  Administered 2024-10-03: 40 meq via ORAL
  Filled 2024-10-03: qty 2

## 2024-10-03 MED ORDER — DICYCLOMINE HCL 20 MG PO TABS: 20.0000 mg | ORAL_TABLET | Freq: Two times a day (BID) | ORAL | 0 refills | Status: AC

## 2024-10-03 MED ORDER — MECLIZINE HCL 25 MG PO TABS
25.0000 mg | ORAL_TABLET | Freq: Once | ORAL | Status: AC
  Administered 2024-10-03: 25 mg via ORAL
  Filled 2024-10-03: qty 1

## 2024-10-03 MED ORDER — ONDANSETRON HCL 4 MG/2ML IJ SOLN
4.0000 mg | Freq: Once | INTRAMUSCULAR | Status: AC
  Administered 2024-10-03: 4 mg via INTRAVENOUS
  Filled 2024-10-03: qty 2

## 2024-10-03 NOTE — Discharge Instructions (Addendum)
 Take over-the-counter Imodium for your diarrhea. Discharging you with Bentyl  for stomach cramps and Compazine for nausea and this also helps with vertigo and spinning sensation. Please read all the attached information I have sent with this discharge paperwork Contact a health care provider if: Your symptoms get worse. You have new symptoms. You have a fever. You cannot drink fluids without vomiting. Your nausea does not go away after 2 days. You feel light-headed or dizzy. You have a headache. You have muscle cramps. You have a rash. You have pain while urinating. Get help right away if: You have pain in your chest, neck, arm, or jaw. You feel extremely weak or you faint. You have persistent vomiting. You have vomit that is bright red or looks like black coffee grounds. You have bloody or black stools (feces) or stools that look like tar. You have a severe headache, a stiff neck, or both. You have severe pain, cramping, or bloating in your abdomen. You have difficulty breathing, or you are breathing very quickly. Your heart is beating very quickly. Your skin feels cold and clammy. You feel confused. You have signs of dehydration, such as: Dark urine, very little urine, or no urine. Cracked lips. Dry mouth. Sunken eyes. Sleepiness. Weakness. These symptoms may be an emergency. Get help right away. Call 911. Do not wait to see if the symptoms will go away. Do not drive yourself to the hospital.

## 2024-10-03 NOTE — Telephone Encounter (Signed)
 FYI Only or Action Required?: FYI only for provider: ED advised.  Patient was last seen in primary care on 09/09/2024 by [place] Adelita PENNER, MD.  Called Nurse Triage reporting Abdominal Pain.  Symptoms began several days ago.  Interventions attempted: Rest, hydration, or home remedies and Dietary changes.  Symptoms are: gradually worsening.  Triage Disposition: Go to ED Now (Notify PCP)  Patient/caregiver understands and will follow disposition?: Yes  Reason for Disposition  Blood in bowel movements  (Exception: Blood on surface of BM with constipation.)  Answer Assessment - Initial Assessment Questions Patient states that she ate out at a restaurant a few days ago and had some vomiting and diarrhea with stomach cramps after this. She thought it may have been from the food, but then yesterday and today she's continued having intermittent sharp stomach pains with diarrhea and noted blood in the stool. She does have a history of diverticulitis and colitis and states that this feels similar to those symptoms. Patient advised to go to ED.   1. LOCATION: Where does it hurt?      Lower abdomen  2. RADIATION: Does the pain shoot anywhere else? (e.g., chest, back)     No  3. ONSET: When did the pain begin? (e.g., minutes, hours or days ago)      A couple days ago  4. SUDDEN: Gradual or sudden onset?     Sudden  5. PATTERN Does the pain come and go, or is it constant?     Comes and goes  6. SEVERITY: How bad is the pain?  (e.g., Scale 1-10; mild, moderate, or severe)     8/10  7. RECURRENT SYMPTOM: Have you ever had this type of stomach pain before? If Yes, ask: When was the last time? and What happened that time?      Yes, with diverticulitis and colitis(last year)  8. CAUSE: What do you think is causing the stomach pain? (e.g., gallstones, recent abdominal surgery)     Concerned it may be diverticulitis or colitis again  9. RELIEVING/AGGRAVATING FACTORS:  What makes it better or worse? (e.g., antacids, bending or twisting motion, bowel movement)     Trying to drink fluids, not eating much because it makes it worse  10. OTHER SYMPTOMS: Do you have any other symptoms? (e.g., back pain, diarrhea, fever, urination pain, vomiting)       Diarrhea, blood in stool, vomiting a few days ago, but none yesterday or today  11. PREGNANCY: Is there any chance you are pregnant? When was your last menstrual period?       No  Protocols used: Abdominal Pain - [facility]  Copied from CRM #[id number]. Topic: Clinical - Red Word Triage >> Oct 03, 2024 10:30 AM China J wrote: [facility] that prompted transfer to Nurse Triage: [place] stomach pain, blood in stool, diarrha, & vomitting.

## 2024-10-03 NOTE — ED Provider Notes (Signed)
 " [facility] AT [facility] Provider Note   CSN: [id number] Arrival date & time: 10/03/24  1513     Patient presents with: Abdominal Pain, Blood In Stools, and Palpitations   Crystal Shea is a 53 y.o. female who presents with multpile complaints.  Chiefly, patient reports 3 days of n/v/d, which she attributes to  eating something bad. She has associated sharp, intermittent stomach cramps. She had one episode of diarrhea with BRPR yesterday and then again today. She denies fever. She does not think she has hemorrhoids. She has pmh of diverticulitis. Patient also reports that 1 week ago she began having intermittent episodes of feeling a little bit dizzy and off balance.  Worse when she moves her head or turns her eyes.  She denies any disequilibrium or ataxia.  No nausea or vomiting.  She denies ringing in her ears.  She denies severe neck pain or headache   Abdominal Pain Palpitations      Prior to Admission medications  Medication Sig Start Date End Date Taking? Authorizing Provider  atorvastatin  (LIPITOR) 40 MG tablet Take 1 tablet (40 mg total) by mouth daily. 08/05/24   [place] Adelita PENNER, MD  cetirizine (ZYRTEC) 10 MG tablet Take 10-20 mg by mouth daily as needed for allergies. 01/24/17   [provider]  escitalopram (LEXAPRO) 10 MG tablet Take 10 mg by mouth daily.    [provider]  Evolocumab  (REPATHA  SURECLICK) 140 MG/ML SOAJ Inject 140 mg into the skin every 14 (fourteen) days. 07/10/24   Swinyer, Rosaline HERO, NP  ezetimibe  (ZETIA ) 10 MG tablet Take 1 tablet (10 mg total) by mouth daily after supper. 08/05/24 07/31/25  Berkeley Adelita PENNER, MD  hydrochlorothiazide  (HYDRODIURIL ) 12.5 MG tablet Take 1 tablet (12.5 mg total) by mouth daily. 08/05/24   [place] Adelita PENNER, MD  lisinopril  (ZESTRIL ) 5 MG tablet Take 1 tablet (5 mg total) by mouth daily. 08/05/24   [place] Adelita PENNER, MD  Olopatadine  HCl 0.2 % SOLN Place into  both eyes as needed.    [provider]  Tetrahydrozoline HCl (VISINE OP) Place 1 drop into both eyes daily. For redness    [provider]  tirzepatide  5 MG/0.5ML injection vial Inject 5 mg into the skin once a week. 09/09/24   [place] Adelita PENNER, MD  valACYclovir (VALTREX) 500 MG tablet 500 mg.    [provider]  Vitamin D , Ergocalciferol , (DRISDOL ) 1.25 MG (50000 UNIT) CAPS capsule Take 1 capsule (50,000 Units total) by mouth every 7 (seven) days. 06/24/24   [place] Adelita PENNER, MD    Allergies: Propofol    Review of Systems  Cardiovascular:  Positive for palpitations.  Gastrointestinal:  Positive for abdominal pain.    Updated Vital Signs BP (!) 150/69   Pulse (!) 58   Temp (!) 97.4 F (36.3 C)   Resp 20   Ht 5' 2 (1.575 m)   Wt 86.2 kg   LMP 02/03/2015   SpO2 100%   BMI 34.75 kg/m   Physical Exam Vitals and nursing note reviewed.  Constitutional:      General: She is not in acute distress.    Appearance: She is well-developed. She is not diaphoretic.  HENT:     Head: Normocephalic and atraumatic.     Right Ear: External ear normal.     Left Ear: External ear normal.     Nose: Nose normal.     Mouth/Throat:     Mouth: Mucous membranes are  moist.  Eyes:     General: No scleral icterus.    Conjunctiva/sclera: Conjunctivae normal.  Cardiovascular:     Rate and Rhythm: Normal rate and regular rhythm.     Heart sounds: Normal heart sounds. No murmur heard.    No friction rub. No gallop.  Pulmonary:     Effort: Pulmonary effort is normal. No respiratory distress.     Breath sounds: Normal breath sounds.  Abdominal:     General: Bowel sounds are normal. There is no distension.     Palpations: Abdomen is soft. There is no mass.     Tenderness: There is no abdominal tenderness. There is no guarding.  Musculoskeletal:     Cervical back: Normal range of motion.  Skin:    General: Skin is warm and dry.  Neurological:     Mental Status:  She is alert and oriented to person, place, and time.     GCS: GCS eye subscore is 4. GCS verbal subscore is 5. GCS motor subscore is 6.     Cranial Nerves: Cranial nerves 2-12 are intact.     Sensory: Sensation is intact.     Motor: Motor function is intact.     Coordination: Coordination is intact.     Gait: Gait is intact.     Deep Tendon Reflexes: Reflexes are normal and symmetric.     Comments: Nonsustained right lateral nystagmus which creates dizziness. Positive head impulse test with mild saccade. Negative test of skew   Psychiatric:        Behavior: Behavior normal.     (all labs ordered are listed, but only abnormal results are displayed) Labs Reviewed  COMPREHENSIVE METABOLIC PANEL WITH GFR - Abnormal; Notable for the following components:      Result Value   Potassium 3.2 (*)    All other components within normal limits  URINALYSIS, ROUTINE W REFLEX MICROSCOPIC - Abnormal; Notable for the following components:   Hgb urine dipstick SMALL (*)    Leukocytes,Ua MODERATE (*)    Bacteria, UA RARE (*)    All other components within normal limits  LIPASE, BLOOD  CBC    EKG: None  Radiology: No results found.   Procedures   Medications Ordered in the ED  meclizine  (ANTIVERT ) tablet 25 mg (has no administration in time range)  sodium chloride  0.9 % bolus 1,000 mL (has no administration in time range)  ondansetron  (ZOFRAN ) injection 4 mg (has no administration in time range)    Clinical Course as of 10/03/24 1922  Fri Oct 03, 2024  1906 Urinalysis, Routine w reflex microscopic -Urine, Clean Catch(!) Urine appears contaminated. No urinary sxs  [AH]  1906 Hemoglobin: 13.7 [AH]    Clinical Course User Index [AH] Arloa Chroman, PA-C                                 Medical Decision Making Amount and/or Complexity of Data Reviewed Labs: ordered. Decision-making details documented in ED Course. Radiology: ordered.  Risk Prescription drug  management.   Patient here with complaints of nausea vomiting and diarrhea, occasional Blietz bright red blood per rectum after multiple episodes of diarrhea. The differential diagnosis of diarrhea includes but is not limited to Viral- norovirus/rotavirus; Bacterial-Campylobacter,Shigella, Salmonella, Escherichia coli, E. coli 0157:H7, Yersinia enterocolitica, Vibrio cholerae, Clostridium difficile. Parasitic- Giardia lamblia, Cryptosporidium,Entamoeba histolytica,Cyclospora, Microsporidium. Toxin- Staphylococcus aureus, Bacillus cereus. Noninfectious causes include GI Bleed, Appendicitis, Mesenteric Ischemia, Diverticulitis, Adrenal  Crisis, Thyroid  Storm, Toxicologic exposures, Antibiotic or drug-associated, inflammatory bowel disease.  Also having intermittent episodes of vertiginous symptoms worse with head movement, hints exam is consistent with peripheral vertigo.  Ambulatory with a normal neurologic examination   I ordered labs as per ED course there are no significant findings no elevated white count mildly low potassium at 3.2 will replete orally potassium may be due to GI loss. Urine appears contaminated no urinary symptoms.  I ordered visualized interpreted CT abdomen pelvis which shows no acute findings.  She did not appear to have any evidence of hemorrhage.  Patient will be discharged with symptomatic treatment and outpatient follow-up with her GI specialist.  Discussed return precautions.    Final diagnoses:  None    ED Discharge Orders     None          Arloa Chroman, PA-C 10/03/24 1932    Pamella Ozell LABOR, DO 10/09/24 1313  "

## 2024-10-03 NOTE — Telephone Encounter (Signed)
 Appointment scheduled to with another office. Dm/cma

## 2024-10-03 NOTE — ED Triage Notes (Signed)
 Arrives ambulatory to the ED with complaints of worsening abdominal pain and intermittent blood in her stool x1 week. Patient does report a hx of diverticulitis.

## 2024-10-06 ENCOUNTER — Ambulatory Visit (INDEPENDENT_AMBULATORY_CARE_PROVIDER_SITE_OTHER): Admitting: Family Medicine

## 2024-10-06 ENCOUNTER — Other Ambulatory Visit (HOSPITAL_COMMUNITY): Payer: Self-pay

## 2024-10-06 ENCOUNTER — Encounter (INDEPENDENT_AMBULATORY_CARE_PROVIDER_SITE_OTHER): Payer: Self-pay | Admitting: Family Medicine

## 2024-10-06 VITALS — BP 120/65 | HR 60 | Temp 98.0°F | Ht 62.0 in | Wt 190.0 lb

## 2024-10-06 DIAGNOSIS — E559 Vitamin D deficiency, unspecified: Secondary | ICD-10-CM | POA: Diagnosis not present

## 2024-10-06 DIAGNOSIS — E782 Mixed hyperlipidemia: Secondary | ICD-10-CM

## 2024-10-06 DIAGNOSIS — E66812 Obesity, class 2: Secondary | ICD-10-CM | POA: Diagnosis not present

## 2024-10-06 DIAGNOSIS — Z6834 Body mass index (BMI) 34.0-34.9, adult: Secondary | ICD-10-CM | POA: Diagnosis not present

## 2024-10-06 DIAGNOSIS — E7800 Pure hypercholesterolemia, unspecified: Secondary | ICD-10-CM

## 2024-10-06 DIAGNOSIS — Z6835 Body mass index (BMI) 35.0-35.9, adult: Secondary | ICD-10-CM

## 2024-10-06 DIAGNOSIS — I10 Essential (primary) hypertension: Secondary | ICD-10-CM

## 2024-10-06 MED ORDER — LISINOPRIL 5 MG PO TABS
5.0000 mg | ORAL_TABLET | Freq: Every day | ORAL | 0 refills | Status: AC
  Filled 2024-10-06 – 2024-11-13 (×2): qty 90, 90d supply, fill #0

## 2024-10-06 MED ORDER — TIRZEPATIDE-WEIGHT MANAGEMENT 5 MG/0.5ML ~~LOC~~ SOLN: 5.0000 mg | SUBCUTANEOUS | 0 refills | Status: DC

## 2024-10-06 MED ORDER — VITAMIN D (ERGOCALCIFEROL) 1.25 MG (50000 UNIT) PO CAPS
50000.0000 [IU] | ORAL_CAPSULE | ORAL | 0 refills | Status: AC
  Filled 2024-10-06 – 2024-11-13 (×2): qty 12, 84d supply, fill #0

## 2024-10-06 MED ORDER — EZETIMIBE 10 MG PO TABS
10.0000 mg | ORAL_TABLET | Freq: Every day | ORAL | 0 refills | Status: AC
  Filled 2024-10-06 – 2024-11-13 (×2): qty 90, 90d supply, fill #0

## 2024-10-06 MED ORDER — HYDROCHLOROTHIAZIDE 12.5 MG PO TABS
12.5000 mg | ORAL_TABLET | Freq: Every day | ORAL | 0 refills | Status: AC
  Filled 2024-10-06 – 2024-11-13 (×2): qty 90, 90d supply, fill #0

## 2024-10-06 NOTE — Progress Notes (Signed)
 "  SUBJECTIVE:  Chief Complaint: Obesity  Interim History: Patient here for first follow up since starting tirzepatide . She could tell a difference immediately since starting medication.  She felt significant change in food noise control.  Had one episode of GI side effects that she mentions she associated with eating higher fat food.  She is anticipating staying local for a low key holiday break.  She took all of next week off.    Crystal Shea is here to discuss her progress with her obesity treatment plan. She is on the keeping a food journal and adhering to recommended goals of 1000-1100 calories and 75 grams of protein and states she is following her eating plan approximately 25% of the time. She states she is exercising a little.   OBJECTIVE: Visit Diagnoses: Problem List Items Addressed This Visit       Cardiovascular and Mediastinum   Essential hypertension - Primary     Other   Hyperlipidemia   Vitamin D  deficiency   Other Visit Diagnoses       BMI 34.0-34.9,adult         Class 2 severe obesity with serious comorbidity and body mass index (BMI) of 35.0 to 35.9 in adult, unspecified obesity type           Vitals Temp: 98 F (36.7 C) BP: 120/65 Pulse Rate: 60 SpO2: 100 %   Anthropometric Measurements Height: 5' 2 (1.575 m) Weight: 190 lb (86.2 kg) BMI (Calculated): 34.74 Weight at Last Visit: 200 lb Weight Lost Since Last Visit: 10 Weight Gained Since Last Visit: 0 Starting Weight: 192 lb Total Weight Loss (lbs): 2 lb (0.907 kg)   Body Composition  Body Fat %: 45.3 % Fat Mass (lbs): 86.4 lbs Muscle Mass (lbs): 99 lbs Total Body Water (lbs): 71.2 lbs Visceral Fat Rating : 12   Other Clinical Data Today's Visit #: 19 Starting Date: 03/20/23 Comments: 1000-1100/75     ASSESSMENT AND PLAN: Assessment & Plan Essential hypertension Blood pressure is well-managed today and has been consistently within normal range at this office.  Needs refills of her  medication today.  Will refill hydrochlorothiazide  as well as lisinopril  and follow-up on blood pressures at next appointment with continued weight loss should expect to be able to titrate down on medication dosages. Pure hypercholesterolemia Patient has tolerated Zetia  and needs a refill of that medication at this time.  Will continue current medication dosage and will need repeat labs either here or with primary care in the next 2 to 3 months. Vitamin D  deficiency Tolerating prescription strength vitamin D  and needs a refill today.  Patient does not voice any concerns of symptoms of over replacement at this time.  Refill sent to pharmacy. BMI 34.0-34.9,adult  Class 2 severe obesity with serious comorbidity and body mass index (BMI) of 35.0 to 35.9 in adult, unspecified obesity type    Diet: Crystal Shea is currently in the action stage of change. As such, her goal is to continue with weight loss efforts and has agreed to keeping a food journal and adhering to recommended goals of 1000-1100 calories and 75 or more grams of protein daily.   Exercise:  For substantial health benefits, adults should do at least 150 minutes (2 hours and 30 minutes) a week of moderate-intensity, or 75 minutes (1 hour and 15 minutes) a week of vigorous-intensity aerobic physical activity, or an equivalent combination of moderate- and vigorous-intensity aerobic activity. Aerobic activity should be performed in episodes of at least 10 minutes,  and preferably, it should be spread throughout the week.  Behavior Modification:  We discussed the following Behavioral Modification Strategies today: increasing lean protein intake, decreasing simple carbohydrates, meal planning and cooking strategies, holiday eating strategies, planning for success, and keep a strict food journal. We discussed various medication options to help Crystal Shea with her weight loss efforts and we both agreed to continue tirzepatide  at current dose with no  change.  Return in about 5 weeks (around 11/10/2024).   She was informed of the importance of frequent follow up visits to maximize her success with intensive lifestyle modifications for her multiple health conditions.  Attestation Statements:   Reviewed by clinician on day of visit: allergies, medications, problem list, medical history, surgical history, family history, social history, and previous encounter notes.  Crystal Cho, MD "

## 2024-10-13 NOTE — Assessment & Plan Note (Signed)
 Tolerating prescription strength vitamin D  and needs a refill today.  Patient does not voice any concerns of symptoms of over replacement at this time.  Refill sent to pharmacy.

## 2024-10-13 NOTE — Assessment & Plan Note (Signed)
 Blood pressure is well-managed today and has been consistently within normal range at this office.  Needs refills of her medication today.  Will refill hydrochlorothiazide  as well as lisinopril  and follow-up on blood pressures at next appointment with continued weight loss should expect to be able to titrate down on medication dosages.

## 2024-10-13 NOTE — Assessment & Plan Note (Signed)
 Patient has tolerated Zetia  and needs a refill of that medication at this time.  Will continue current medication dosage and will need repeat labs either here or with primary care in the next 2 to 3 months.

## 2024-10-17 ENCOUNTER — Other Ambulatory Visit (HOSPITAL_COMMUNITY): Payer: Self-pay

## 2024-10-21 ENCOUNTER — Other Ambulatory Visit (HOSPITAL_COMMUNITY): Payer: Self-pay

## 2024-11-05 ENCOUNTER — Other Ambulatory Visit (INDEPENDENT_AMBULATORY_CARE_PROVIDER_SITE_OTHER): Payer: Self-pay | Admitting: Family Medicine

## 2024-11-05 DIAGNOSIS — E7800 Pure hypercholesterolemia, unspecified: Secondary | ICD-10-CM

## 2024-11-10 ENCOUNTER — Ambulatory Visit (INDEPENDENT_AMBULATORY_CARE_PROVIDER_SITE_OTHER): Admitting: Family Medicine

## 2024-11-11 ENCOUNTER — Encounter (INDEPENDENT_AMBULATORY_CARE_PROVIDER_SITE_OTHER): Payer: Self-pay

## 2024-11-13 ENCOUNTER — Encounter (INDEPENDENT_AMBULATORY_CARE_PROVIDER_SITE_OTHER): Payer: Self-pay | Admitting: Family Medicine

## 2024-11-13 ENCOUNTER — Other Ambulatory Visit (HOSPITAL_COMMUNITY): Payer: Self-pay

## 2024-11-17 ENCOUNTER — Telehealth (INDEPENDENT_AMBULATORY_CARE_PROVIDER_SITE_OTHER): Admitting: Family Medicine

## 2024-11-17 VITALS — Ht 62.0 in

## 2024-11-17 DIAGNOSIS — E66812 Obesity, class 2: Secondary | ICD-10-CM | POA: Diagnosis not present

## 2024-11-17 DIAGNOSIS — E782 Mixed hyperlipidemia: Secondary | ICD-10-CM

## 2024-11-17 DIAGNOSIS — R7303 Prediabetes: Secondary | ICD-10-CM

## 2024-11-17 DIAGNOSIS — Z6835 Body mass index (BMI) 35.0-35.9, adult: Secondary | ICD-10-CM | POA: Diagnosis not present

## 2024-11-17 MED ORDER — WEGOVY 1.5 MG PO TABS: 1.5000 mg | ORAL_TABLET | Freq: Every day | ORAL | 0 refills | Status: DC

## 2024-11-19 ENCOUNTER — Other Ambulatory Visit (HOSPITAL_COMMUNITY): Payer: Self-pay

## 2024-11-19 ENCOUNTER — Other Ambulatory Visit (INDEPENDENT_AMBULATORY_CARE_PROVIDER_SITE_OTHER): Payer: Self-pay

## 2024-11-19 DIAGNOSIS — Z6835 Body mass index (BMI) 35.0-35.9, adult: Secondary | ICD-10-CM

## 2024-11-19 MED ORDER — WEGOVY 1.5 MG PO TABS
1.5000 mg | ORAL_TABLET | Freq: Every day | ORAL | 0 refills | Status: AC
  Filled 2024-11-19: qty 30, 30d supply, fill #0

## 2024-12-16 ENCOUNTER — Ambulatory Visit (INDEPENDENT_AMBULATORY_CARE_PROVIDER_SITE_OTHER): Admitting: Family Medicine

## 2025-05-06 ENCOUNTER — Encounter: Admitting: Family Medicine
